# Patient Record
Sex: Male | Born: 1965 | Race: White | Hispanic: No | Marital: Married | Smoking: Current every day smoker
Health system: Southern US, Community
[De-identification: ages and names within clinical notes are randomized; demographics above are authoritative.]

## PROBLEM LIST (undated history)

## (undated) DIAGNOSIS — E119 Type 2 diabetes mellitus without complications: Secondary | ICD-10-CM

## (undated) DIAGNOSIS — I219 Acute myocardial infarction, unspecified: Secondary | ICD-10-CM

## (undated) DIAGNOSIS — E785 Hyperlipidemia, unspecified: Secondary | ICD-10-CM

## (undated) DIAGNOSIS — I2510 Atherosclerotic heart disease of native coronary artery without angina pectoris: Secondary | ICD-10-CM

## (undated) DIAGNOSIS — I10 Essential (primary) hypertension: Secondary | ICD-10-CM

## (undated) DIAGNOSIS — I82409 Acute embolism and thrombosis of unspecified deep veins of unspecified lower extremity: Secondary | ICD-10-CM

## (undated) HISTORY — PX: HERNIA REPAIR: SHX51

## (undated) HISTORY — PX: CARDIAC CATHETERIZATION: SHX172

## (undated) HISTORY — PX: TONSILLECTOMY: SUR1361

## (undated) HISTORY — DX: Acute embolism and thrombosis of unspecified deep veins of unspecified lower extremity: I82.409

---

## 2016-02-28 DIAGNOSIS — M79605 Pain in left leg: Secondary | ICD-10-CM | POA: Diagnosis not present

## 2016-03-03 DIAGNOSIS — I10 Essential (primary) hypertension: Secondary | ICD-10-CM | POA: Diagnosis not present

## 2016-03-03 DIAGNOSIS — E1165 Type 2 diabetes mellitus with hyperglycemia: Secondary | ICD-10-CM | POA: Diagnosis not present

## 2016-04-13 DIAGNOSIS — Z0000 Encounter for general adult medical examination without abnormal findings: Secondary | ICD-10-CM | POA: Diagnosis not present

## 2016-04-13 DIAGNOSIS — E119 Type 2 diabetes mellitus without complications: Secondary | ICD-10-CM | POA: Diagnosis not present

## 2016-04-13 DIAGNOSIS — I10 Essential (primary) hypertension: Secondary | ICD-10-CM | POA: Diagnosis not present

## 2016-04-29 DIAGNOSIS — E559 Vitamin D deficiency, unspecified: Secondary | ICD-10-CM | POA: Diagnosis not present

## 2016-04-29 DIAGNOSIS — I10 Essential (primary) hypertension: Secondary | ICD-10-CM | POA: Diagnosis not present

## 2016-04-29 DIAGNOSIS — E119 Type 2 diabetes mellitus without complications: Secondary | ICD-10-CM | POA: Diagnosis not present

## 2016-05-27 ENCOUNTER — Other Ambulatory Visit (HOSPITAL_COMMUNITY): Payer: Self-pay | Admitting: Internal Medicine

## 2016-05-27 DIAGNOSIS — S96911A Strain of unspecified muscle and tendon at ankle and foot level, right foot, initial encounter: Secondary | ICD-10-CM | POA: Diagnosis not present

## 2016-05-27 DIAGNOSIS — M79671 Pain in right foot: Secondary | ICD-10-CM | POA: Diagnosis not present

## 2016-05-27 DIAGNOSIS — I825Z2 Chronic embolism and thrombosis of unspecified deep veins of left distal lower extremity: Secondary | ICD-10-CM

## 2016-05-27 DIAGNOSIS — Z86718 Personal history of other venous thrombosis and embolism: Secondary | ICD-10-CM | POA: Diagnosis not present

## 2016-05-27 DIAGNOSIS — Z7901 Long term (current) use of anticoagulants: Secondary | ICD-10-CM | POA: Diagnosis not present

## 2016-05-29 ENCOUNTER — Ambulatory Visit (HOSPITAL_COMMUNITY): Payer: Self-pay

## 2016-06-03 DIAGNOSIS — E785 Hyperlipidemia, unspecified: Secondary | ICD-10-CM | POA: Diagnosis not present

## 2016-06-03 DIAGNOSIS — E291 Testicular hypofunction: Secondary | ICD-10-CM | POA: Diagnosis not present

## 2016-06-03 DIAGNOSIS — E119 Type 2 diabetes mellitus without complications: Secondary | ICD-10-CM | POA: Diagnosis not present

## 2016-07-07 ENCOUNTER — Other Ambulatory Visit (HOSPITAL_COMMUNITY): Payer: Self-pay | Admitting: Internal Medicine

## 2016-07-07 DIAGNOSIS — E041 Nontoxic single thyroid nodule: Secondary | ICD-10-CM

## 2016-07-13 ENCOUNTER — Ambulatory Visit (HOSPITAL_COMMUNITY)
Admission: RE | Admit: 2016-07-13 | Discharge: 2016-07-13 | Disposition: A | Discharge status: Home / Self Care | Payer: 59 | Source: Ambulatory Visit | Attending: Internal Medicine | Admitting: Internal Medicine

## 2016-07-13 DIAGNOSIS — E041 Nontoxic single thyroid nodule: Secondary | ICD-10-CM | POA: Insufficient documentation

## 2016-07-13 DIAGNOSIS — E049 Nontoxic goiter, unspecified: Secondary | ICD-10-CM | POA: Diagnosis not present

## 2016-07-13 DIAGNOSIS — R599 Enlarged lymph nodes, unspecified: Secondary | ICD-10-CM | POA: Diagnosis not present

## 2016-07-29 DIAGNOSIS — E785 Hyperlipidemia, unspecified: Secondary | ICD-10-CM | POA: Diagnosis not present

## 2016-07-29 DIAGNOSIS — E119 Type 2 diabetes mellitus without complications: Secondary | ICD-10-CM | POA: Diagnosis not present

## 2016-07-29 DIAGNOSIS — E291 Testicular hypofunction: Secondary | ICD-10-CM | POA: Diagnosis not present

## 2016-12-07 DIAGNOSIS — I10 Essential (primary) hypertension: Secondary | ICD-10-CM | POA: Diagnosis not present

## 2016-12-07 DIAGNOSIS — E119 Type 2 diabetes mellitus without complications: Secondary | ICD-10-CM | POA: Diagnosis not present

## 2016-12-07 DIAGNOSIS — E785 Hyperlipidemia, unspecified: Secondary | ICD-10-CM | POA: Diagnosis not present

## 2016-12-24 ENCOUNTER — Ambulatory Visit: Payer: 59 | Admitting: General Surgery

## 2017-01-03 ENCOUNTER — Encounter (HOSPITAL_COMMUNITY)
Admission: EM | Disposition: A | Discharge status: Home / Self Care | Payer: Self-pay | Source: Other Acute Inpatient Hospital | Attending: Interventional Cardiology

## 2017-01-03 ENCOUNTER — Inpatient Hospital Stay (HOSPITAL_COMMUNITY)
Admission: EM | Admit: 2017-01-03 | Discharge: 2017-01-07 | DRG: 247 | Disposition: A | Discharge status: Home / Self Care | Payer: Medicaid Other | Source: Other Acute Inpatient Hospital | Attending: Cardiovascular Disease | Admitting: Cardiovascular Disease

## 2017-01-03 ENCOUNTER — Encounter (HOSPITAL_COMMUNITY): Payer: Self-pay | Admitting: General Practice

## 2017-01-03 ENCOUNTER — Other Ambulatory Visit: Payer: Self-pay

## 2017-01-03 DIAGNOSIS — E663 Overweight: Secondary | ICD-10-CM | POA: Diagnosis present

## 2017-01-03 DIAGNOSIS — Z955 Presence of coronary angioplasty implant and graft: Secondary | ICD-10-CM

## 2017-01-03 DIAGNOSIS — I82409 Acute embolism and thrombosis of unspecified deep veins of unspecified lower extremity: Secondary | ICD-10-CM | POA: Diagnosis present

## 2017-01-03 DIAGNOSIS — E118 Type 2 diabetes mellitus with unspecified complications: Secondary | ICD-10-CM | POA: Diagnosis not present

## 2017-01-03 DIAGNOSIS — R0602 Shortness of breath: Secondary | ICD-10-CM

## 2017-01-03 DIAGNOSIS — E7800 Pure hypercholesterolemia, unspecified: Secondary | ICD-10-CM | POA: Diagnosis not present

## 2017-01-03 DIAGNOSIS — Z794 Long term (current) use of insulin: Secondary | ICD-10-CM | POA: Diagnosis not present

## 2017-01-03 DIAGNOSIS — F17210 Nicotine dependence, cigarettes, uncomplicated: Secondary | ICD-10-CM | POA: Diagnosis present

## 2017-01-03 DIAGNOSIS — I5030 Unspecified diastolic (congestive) heart failure: Secondary | ICD-10-CM | POA: Diagnosis not present

## 2017-01-03 DIAGNOSIS — E119 Type 2 diabetes mellitus without complications: Secondary | ICD-10-CM | POA: Diagnosis present

## 2017-01-03 DIAGNOSIS — I2111 ST elevation (STEMI) myocardial infarction involving right coronary artery: Secondary | ICD-10-CM | POA: Diagnosis not present

## 2017-01-03 DIAGNOSIS — E1159 Type 2 diabetes mellitus with other circulatory complications: Secondary | ICD-10-CM | POA: Diagnosis not present

## 2017-01-03 DIAGNOSIS — R509 Fever, unspecified: Secondary | ICD-10-CM

## 2017-01-03 DIAGNOSIS — Z6831 Body mass index (BMI) 31.0-31.9, adult: Secondary | ICD-10-CM | POA: Diagnosis not present

## 2017-01-03 DIAGNOSIS — I2119 ST elevation (STEMI) myocardial infarction involving other coronary artery of inferior wall: Principal | ICD-10-CM | POA: Diagnosis present

## 2017-01-03 DIAGNOSIS — I82401 Acute embolism and thrombosis of unspecified deep veins of right lower extremity: Secondary | ICD-10-CM | POA: Diagnosis not present

## 2017-01-03 DIAGNOSIS — I2510 Atherosclerotic heart disease of native coronary artery without angina pectoris: Secondary | ICD-10-CM | POA: Diagnosis present

## 2017-01-03 DIAGNOSIS — I255 Ischemic cardiomyopathy: Secondary | ICD-10-CM | POA: Diagnosis present

## 2017-01-03 DIAGNOSIS — J101 Influenza due to other identified influenza virus with other respiratory manifestations: Secondary | ICD-10-CM | POA: Diagnosis not present

## 2017-01-03 DIAGNOSIS — I252 Old myocardial infarction: Secondary | ICD-10-CM | POA: Diagnosis not present

## 2017-01-03 DIAGNOSIS — Z86718 Personal history of other venous thrombosis and embolism: Secondary | ICD-10-CM | POA: Diagnosis not present

## 2017-01-03 DIAGNOSIS — I10 Essential (primary) hypertension: Secondary | ICD-10-CM | POA: Diagnosis present

## 2017-01-03 DIAGNOSIS — E785 Hyperlipidemia, unspecified: Secondary | ICD-10-CM | POA: Diagnosis present

## 2017-01-03 HISTORY — DX: Essential (primary) hypertension: I10

## 2017-01-03 HISTORY — PX: LEFT HEART CATH AND CORONARY ANGIOGRAPHY: CATH118249

## 2017-01-03 HISTORY — DX: Hyperlipidemia, unspecified: E78.5

## 2017-01-03 HISTORY — DX: Type 2 diabetes mellitus without complications: E11.9

## 2017-01-03 HISTORY — PX: CORONARY/GRAFT ACUTE MI REVASCULARIZATION: CATH118305

## 2017-01-03 HISTORY — DX: Acute myocardial infarction, unspecified: I21.9

## 2017-01-03 HISTORY — DX: Atherosclerotic heart disease of native coronary artery without angina pectoris: I25.10

## 2017-01-03 LAB — COMPREHENSIVE METABOLIC PANEL
ALT: 65 U/L — AB (ref 17–63)
AST: 128 U/L — ABNORMAL HIGH (ref 15–41)
Albumin: 3.6 g/dL (ref 3.5–5.0)
Alkaline Phosphatase: 41 U/L (ref 38–126)
Anion gap: 13 (ref 5–15)
BILIRUBIN TOTAL: 1.1 mg/dL (ref 0.3–1.2)
BUN: 12 mg/dL (ref 6–20)
CHLORIDE: 103 mmol/L (ref 101–111)
CO2: 20 mmol/L — ABNORMAL LOW (ref 22–32)
CREATININE: 0.66 mg/dL (ref 0.61–1.24)
Calcium: 8.2 mg/dL — ABNORMAL LOW (ref 8.9–10.3)
Glucose, Bld: 288 mg/dL — ABNORMAL HIGH (ref 65–99)
POTASSIUM: 3.8 mmol/L (ref 3.5–5.1)
Sodium: 136 mmol/L (ref 135–145)
TOTAL PROTEIN: 6 g/dL — AB (ref 6.5–8.1)

## 2017-01-03 LAB — GLUCOSE, CAPILLARY
GLUCOSE-CAPILLARY: 235 mg/dL — AB (ref 65–99)
Glucose-Capillary: 231 mg/dL — ABNORMAL HIGH (ref 65–99)

## 2017-01-03 LAB — HEMOGLOBIN A1C
HEMOGLOBIN A1C: 10.4 % — AB (ref 4.8–5.6)
MEAN PLASMA GLUCOSE: 251.78 mg/dL

## 2017-01-03 LAB — CBC
HCT: 44.4 % (ref 39.0–52.0)
HEMOGLOBIN: 16 g/dL (ref 13.0–17.0)
MCH: 33.1 pg (ref 26.0–34.0)
MCHC: 36 g/dL (ref 30.0–36.0)
MCV: 91.9 fL (ref 78.0–100.0)
Platelets: 178 10*3/uL (ref 150–400)
RBC: 4.83 MIL/uL (ref 4.22–5.81)
RDW: 11.8 % (ref 11.5–15.5)
WBC: 8.8 10*3/uL (ref 4.0–10.5)

## 2017-01-03 LAB — MRSA PCR SCREENING: MRSA BY PCR: NEGATIVE

## 2017-01-03 LAB — POCT ACTIVATED CLOTTING TIME
Activated Clotting Time: 186 seconds
Activated Clotting Time: 296 seconds

## 2017-01-03 LAB — TROPONIN I: Troponin I: 13.08 ng/mL (ref <0.03)

## 2017-01-03 LAB — PROTIME-INR
INR: 1.18
PROTHROMBIN TIME: 14.9 s (ref 11.4–15.2)

## 2017-01-03 LAB — APTT

## 2017-01-03 LAB — LIPID PANEL
CHOLESTEROL: 134 mg/dL (ref 0–200)
HDL: 22 mg/dL — ABNORMAL LOW (ref >40)
LDL Cholesterol: UNDETERMINED mg/dL (ref 0–99)
TRIGLYCERIDES: 472 mg/dL — AB (ref <150)
Total CHOL/HDL Ratio: 6.1 RATIO
VLDL: UNDETERMINED mg/dL (ref 0–40)

## 2017-01-03 SURGERY — CORONARY/GRAFT ACUTE MI REVASCULARIZATION
Anesthesia: LOCAL

## 2017-01-03 MED ORDER — TICAGRELOR 90 MG PO TABS
90.0000 mg | ORAL_TABLET | Freq: Two times a day (BID) | ORAL | Status: DC
  Administered 2017-01-03 – 2017-01-05 (×4): 90 mg via ORAL
  Filled 2017-01-03 (×4): qty 1

## 2017-01-03 MED ORDER — SODIUM CHLORIDE 0.9% FLUSH: 3.0000 mL | INTRAVENOUS | Status: DC | PRN

## 2017-01-03 MED ORDER — HYDROMORPHONE HCL 1 MG/ML IJ SOLN
1.0000 mg | INTRAMUSCULAR | Status: DC | PRN
  Administered 2017-01-03 – 2017-01-04 (×2): 1 mg via INTRAVENOUS
  Filled 2017-01-03 (×2): qty 1

## 2017-01-03 MED ORDER — HEPARIN SODIUM (PORCINE) 1000 UNIT/ML IJ SOLN
INTRAMUSCULAR | Status: DC | PRN
  Administered 2017-01-03: 9000 [IU] via INTRAVENOUS
  Administered 2017-01-03: 5000 [IU] via INTRAVENOUS

## 2017-01-03 MED ORDER — HYDRALAZINE HCL 20 MG/ML IJ SOLN: 5.0000 mg | INTRAMUSCULAR | Status: AC | PRN

## 2017-01-03 MED ORDER — VERAPAMIL HCL 2.5 MG/ML IV SOLN
INTRAVENOUS | Status: DC | PRN
  Administered 2017-01-03: 10 mL via INTRA_ARTERIAL

## 2017-01-03 MED ORDER — TIROFIBAN HCL IN NACL 5-0.9 MG/100ML-% IV SOLN
INTRAVENOUS | Status: AC
  Filled 2017-01-03: qty 100

## 2017-01-03 MED ORDER — ACETAMINOPHEN 325 MG PO TABS
650.0000 mg | ORAL_TABLET | ORAL | Status: DC | PRN
  Administered 2017-01-04 – 2017-01-06 (×10): 650 mg via ORAL
  Filled 2017-01-03 (×10): qty 2

## 2017-01-03 MED ORDER — FENTANYL CITRATE (PF) 100 MCG/2ML IJ SOLN
INTRAMUSCULAR | Status: AC
  Filled 2017-01-03: qty 2

## 2017-01-03 MED ORDER — HEPARIN (PORCINE) IN NACL 2-0.9 UNIT/ML-% IJ SOLN
INTRAMUSCULAR | Status: AC
  Filled 2017-01-03: qty 500

## 2017-01-03 MED ORDER — MELATONIN 3 MG PO TABS
3.0000 mg | ORAL_TABLET | Freq: Every day | ORAL | Status: DC
  Administered 2017-01-03 – 2017-01-06 (×4): 3 mg via ORAL
  Filled 2017-01-03 (×4): qty 1

## 2017-01-03 MED ORDER — FENTANYL CITRATE (PF) 100 MCG/2ML IJ SOLN
INTRAMUSCULAR | Status: DC | PRN
  Administered 2017-01-03 (×2): 25 ug via INTRAVENOUS
  Administered 2017-01-03: 50 ug via INTRAVENOUS

## 2017-01-03 MED ORDER — ASPIRIN 81 MG PO CHEW
81.0000 mg | CHEWABLE_TABLET | Freq: Every day | ORAL | Status: DC
  Administered 2017-01-03 – 2017-01-07 (×5): 81 mg via ORAL
  Filled 2017-01-03 (×5): qty 1

## 2017-01-03 MED ORDER — IOPAMIDOL (ISOVUE-370) INJECTION 76%
INTRAVENOUS | Status: DC | PRN
  Administered 2017-01-03: 105 mL via INTRA_ARTERIAL

## 2017-01-03 MED ORDER — MIDAZOLAM HCL 2 MG/2ML IJ SOLN
INTRAMUSCULAR | Status: AC
  Filled 2017-01-03: qty 2

## 2017-01-03 MED ORDER — HEPARIN SODIUM (PORCINE) 1000 UNIT/ML IJ SOLN
INTRAMUSCULAR | Status: AC
  Filled 2017-01-03: qty 1

## 2017-01-03 MED ORDER — TICAGRELOR 90 MG PO TABS
ORAL_TABLET | ORAL | Status: AC
  Filled 2017-01-03: qty 2

## 2017-01-03 MED ORDER — ATORVASTATIN CALCIUM 80 MG PO TABS
80.0000 mg | ORAL_TABLET | Freq: Every day | ORAL | Status: DC
  Administered 2017-01-03 – 2017-01-06 (×4): 80 mg via ORAL
  Filled 2017-01-03 (×4): qty 1

## 2017-01-03 MED ORDER — INSULIN ASPART 100 UNIT/ML ~~LOC~~ SOLN
0.0000 [IU] | Freq: Three times a day (TID) | SUBCUTANEOUS | Status: DC
  Administered 2017-01-03: 5 [IU] via SUBCUTANEOUS
  Administered 2017-01-04 (×2): 8 [IU] via SUBCUTANEOUS
  Administered 2017-01-04 – 2017-01-05 (×2): 5 [IU] via SUBCUTANEOUS

## 2017-01-03 MED ORDER — ONDANSETRON HCL 4 MG/2ML IJ SOLN
4.0000 mg | Freq: Four times a day (QID) | INTRAMUSCULAR | Status: DC | PRN
  Administered 2017-01-06: 4 mg via INTRAVENOUS
  Filled 2017-01-03: qty 2

## 2017-01-03 MED ORDER — SODIUM CHLORIDE 0.9 % IV SOLN
INTRAVENOUS | Status: AC | PRN
  Administered 2017-01-03: 75 mL/h via INTRAVENOUS

## 2017-01-03 MED ORDER — TIROFIBAN HCL IN NACL 5-0.9 MG/100ML-% IV SOLN
INTRAVENOUS | Status: AC | PRN
  Administered 2017-01-03: 0.15 ug/kg/min via INTRAVENOUS

## 2017-01-03 MED ORDER — CHLORHEXIDINE GLUCONATE CLOTH 2 % EX PADS: 6.0000 | MEDICATED_PAD | Freq: Every day | CUTANEOUS | Status: DC

## 2017-01-03 MED ORDER — TIROFIBAN (AGGRASTAT) BOLUS VIA INFUSION
INTRAVENOUS | Status: DC | PRN
  Administered 2017-01-03: 2275 ug via INTRAVENOUS

## 2017-01-03 MED ORDER — SODIUM CHLORIDE 0.9 % IV SOLN: INTRAVENOUS | Status: AC

## 2017-01-03 MED ORDER — MIDAZOLAM HCL 2 MG/2ML IJ SOLN
INTRAMUSCULAR | Status: DC | PRN
  Administered 2017-01-03 (×2): 1 mg via INTRAVENOUS
  Administered 2017-01-03: 2 mg via INTRAVENOUS

## 2017-01-03 MED ORDER — IOPAMIDOL (ISOVUE-370) INJECTION 76%
INTRAVENOUS | Status: AC
  Filled 2017-01-03: qty 125

## 2017-01-03 MED ORDER — SODIUM CHLORIDE 0.9% FLUSH
3.0000 mL | Freq: Two times a day (BID) | INTRAVENOUS | Status: DC
  Administered 2017-01-03: 3 mL via INTRAVENOUS
  Administered 2017-01-04: 10 mL via INTRAVENOUS
  Administered 2017-01-04: 3 mL via INTRAVENOUS
  Administered 2017-01-05: 10 mL via INTRAVENOUS
  Administered 2017-01-06 – 2017-01-07 (×3): 3 mL via INTRAVENOUS

## 2017-01-03 MED ORDER — TICAGRELOR 90 MG PO TABS
ORAL_TABLET | ORAL | Status: DC | PRN
  Administered 2017-01-03: 180 mg via ORAL

## 2017-01-03 MED ORDER — MUPIROCIN 2 % EX OINT: 1.0000 "application " | TOPICAL_OINTMENT | Freq: Two times a day (BID) | CUTANEOUS | Status: DC

## 2017-01-03 MED ORDER — LIDOCAINE HCL (PF) 1 % IJ SOLN
INTRAMUSCULAR | Status: AC
  Filled 2017-01-03: qty 30

## 2017-01-03 MED ORDER — HEPARIN (PORCINE) IN NACL 2-0.9 UNIT/ML-% IJ SOLN
INTRAMUSCULAR | Status: AC | PRN
  Administered 2017-01-03: 1000 mL

## 2017-01-03 MED ORDER — SODIUM CHLORIDE 0.9 % IV SOLN: 250.0000 mL | INTRAVENOUS | Status: DC | PRN

## 2017-01-03 MED ORDER — VERAPAMIL HCL 2.5 MG/ML IV SOLN
INTRAVENOUS | Status: AC
  Filled 2017-01-03: qty 2

## 2017-01-03 MED ORDER — TIROFIBAN HCL IN NACL 5-0.9 MG/100ML-% IV SOLN
0.1500 ug/kg/min | INTRAVENOUS | Status: DC
  Administered 2017-01-03 – 2017-01-04 (×3): 0.15 ug/kg/min via INTRAVENOUS
  Filled 2017-01-03 (×2): qty 100

## 2017-01-03 MED ORDER — CLONAZEPAM 0.5 MG PO TABS: 0.2500 mg | ORAL_TABLET | Freq: Once | ORAL | Status: DC

## 2017-01-03 MED ORDER — LIDOCAINE HCL (PF) 1 % IJ SOLN
INTRAMUSCULAR | Status: DC | PRN
  Administered 2017-01-03: 2 mL via SUBCUTANEOUS

## 2017-01-03 MED ORDER — METOPROLOL TARTRATE 12.5 MG HALF TABLET
12.5000 mg | ORAL_TABLET | Freq: Two times a day (BID) | ORAL | Status: DC
  Administered 2017-01-03 – 2017-01-05 (×5): 12.5 mg via ORAL
  Filled 2017-01-03 (×5): qty 1

## 2017-01-03 MED ORDER — LABETALOL HCL 5 MG/ML IV SOLN: 10.0000 mg | INTRAVENOUS | Status: AC | PRN

## 2017-01-03 MED ORDER — ALPRAZOLAM 0.25 MG PO TABS
0.2500 mg | ORAL_TABLET | Freq: Once | ORAL | Status: AC
  Administered 2017-01-03: 0.25 mg via ORAL
  Filled 2017-01-03: qty 1

## 2017-01-03 SURGICAL SUPPLY — 28 items
BALLN EMERGE MR 3.0X12 (BALLOONS) ×2
BALLN SAPPHIRE 2.5X15 (BALLOONS) ×2
BALLN SAPPHIRE 3.0X12 (BALLOONS) ×2
BALLN ~~LOC~~ EUPHORA RX 3.5X8 (BALLOONS) ×2
BALLOON EMERGE MR 3.0X12 (BALLOONS) ×1 IMPLANT
BALLOON SAPPHIRE 2.5X15 (BALLOONS) ×1 IMPLANT
BALLOON SAPPHIRE 3.0X12 (BALLOONS) ×1 IMPLANT
BALLOON ~~LOC~~ EUPHORA RX 3.5X8 (BALLOONS) ×1 IMPLANT
CATH INFINITI 5 FR JL3.5 (CATHETERS) ×2 IMPLANT
CATH INFINITI 5FR ANG PIGTAIL (CATHETERS) ×2 IMPLANT
CATH LAUNCHER 6FR AL1 (CATHETERS) ×1 IMPLANT
CATH LAUNCHER 6FR JR4 (CATHETERS) ×2 IMPLANT
CATHETER LAUNCHER 6FR AL1 (CATHETERS) ×2
DEVICE RAD COMP TR BAND LRG (VASCULAR PRODUCTS) ×2 IMPLANT
GLIDESHEATH SLEND SS 6F .021 (SHEATH) ×2 IMPLANT
GUIDEWIRE INQWIRE 1.5J.035X260 (WIRE) ×1 IMPLANT
INQWIRE 1.5J .035X260CM (WIRE) ×2
KIT ENCORE 26 ADVANTAGE (KITS) ×2 IMPLANT
KIT HEART LEFT (KITS) ×2 IMPLANT
PACK CARDIAC CATHETERIZATION (CUSTOM PROCEDURE TRAY) ×2 IMPLANT
STENT SYNERGY DES 3X16 (Permanent Stent) ×2 IMPLANT
SYR MEDRAD MARK V 150ML (SYRINGE) ×2 IMPLANT
TRANSDUCER W/STOPCOCK (MISCELLANEOUS) ×2 IMPLANT
TUBING CIL FLEX 10 FLL-RA (TUBING) ×2 IMPLANT
VALVE GUARDIAN II ~~LOC~~ HEMO (MISCELLANEOUS) ×2 IMPLANT
WIRE ASAHI FIELDER XT 190CM (WIRE) ×2 IMPLANT
WIRE ASAHI PROWATER 180CM (WIRE) ×2 IMPLANT
WIRE HI TORQ BMW 190CM (WIRE) ×2 IMPLANT

## 2017-01-03 NOTE — H&P (Signed)
Cardiology Consultation:   Patient ID: Jacob Cooper; [id number]; 08/22/1965   Admit date: 01/03/2017 Date of Consult: 01/04/2017  Primary Care Provider: Wilson SingerGosrani, Nimish C, MD Primary Cardiologist: No primary care provider on file. new    Patient Profile:   Jacob Cooper is a 51 y.o. male with a hx of DM who is being seen today for the evaluation of inferior STEMI at the request of [facility].  History of Present Illness:   Jacob Cooper is a 51 y/o man with DM and HTN who had chest pain yesterday and went to the ER.  He had a negative w/u in the ER but refused admission per the ER MD that I spoke to today.  He came back to the ER today with severe CP and initial ECG showed inferior ST elevation.  We were consulted for management of his STEMI.    He has had for episodes intermittently over the last 2 days of severe chest discomfort.  He continues to have chest discomfort upon arrival to the Cath Lab.  He had 8 mg of morphine for pain control and [facility].   Past Medical History:  Diagnosis Date  . Coronary artery disease   . Diabetes mellitus without complication (HCC)   . Hypertension   . Myocardial infarction [facility](HCC)          Inpatient Medications: Scheduled Meds:  Continuous Infusions:  PRN Meds:   Allergies:   No Known Allergies  Social History:   Social History   Socioeconomic History  . Marital status: Married    Spouse name: Not on file  . Number of children: Not on file  . Years of education: Not on file  . Highest education level: Not on file  Social Needs  . Financial resource strain: Not very hard  . Food insecurity - worry: Patient refused  . Food insecurity - inability: Patient refused  . Transportation needs - medical: Patient refused  . Transportation needs - non-medical: Patient refused  Occupational History  . Not on file  Tobacco Use  . Smoking status: Current Every Day Smoker    Years: 15.00    Types: Pipe  .  Smokeless tobacco: Former NeurosurgeonUser    Types: Chew    Quit date: 1992  Substance and Sexual Activity  . Alcohol use: Yes    Alcohol/week: 1.8 - 2.4 oz    Types: 3 - 4 Shots of liquor per week  . Drug use: No  . Sexual activity: Yes    Partners: Female  Other Topics Concern  . Not on file  Social History Narrative  . Not on file    Family History:   History reviewed. No pertinent family history. Unable to obtain due to acuity of procedure.  Patent too sedated post procedure to answer.  ROS:  Please see the history of present illness.  ROS  Chest pain, All other ROS reviewed and negative.     Physical Exam/Data:   Vitals:   01/03/17 2300 01/03/17 2351 01/04/17 0000 01/04/17 0100  BP: 116/83  (!) 149/98 (!) 140/107  Pulse: 88  86 90  Resp: (!) 21  (!) 23 (!) 23  Temp:  100.3 F (37.9 C)    TempSrc:  Oral    SpO2: 91%  92% 96%  Weight:      Height:        Intake/Output Summary (Last 24 hours) at 01/04/2017 0156 Last data filed at 01/04/2017 0030 Gross per 24  hour  Intake 1490.98 ml  Output 750 ml  Net 740.98 ml   Filed Weights   01/03/17 1500 01/03/17 1519  Weight: 210 lb (95.3 kg) 210 lb (95.3 kg)   Body mass index is 31.93 kg/m.  General:  Well nourished, well developed, in no acute distress HEENT: normal Lymph: no adenopathy Neck: no JVD Endocrine:  No thryomegaly Vascular: No carotid bruits; 2+ right DP pulse,1+ left DP pulse Cardiac:  normal S1, S2; RRR; no murmur  Lungs:  clear to auscultation bilaterally, no wheezing, rhonchi or rales  Abd: soft, nontender, no hepatomegaly  Ext: no edema Musculoskeletal:  No deformities, BUE and BLE strength normal and equal Skin: warm and dry  Neuro:  CNs 2-12 intact, no focal abnormalities noted Psych:  Normal affect   EKG:  The EKG was personally reviewed and demonstrates:  NSR, inferior ST elevation Telemetry:  Telemetry was personally reviewed and demonstrates:    Relevant CV Studies:   Laboratory  Data:  Chemistry Recent Labs  Lab 01/03/17 1426  NA 136  K 3.8  CL 103  CO2 20*  GLUCOSE 288*  BUN 12  CREATININE 0.66  CALCIUM 8.2*  GFRNONAA >60  GFRAA >60  ANIONGAP 13    Recent Labs  Lab 01/03/17 1426  PROT 6.0*  ALBUMIN 3.6  AST 128*  ALT 65*  ALKPHOS 41  BILITOT 1.1   Hematology Recent Labs  Lab 01/03/17 1426  WBC 8.8  RBC 4.83  HGB 16.0  HCT 44.4  MCV 91.9  MCH 33.1  MCHC 36.0  RDW 11.8  PLT 178   Cardiac Enzymes Recent Labs  Lab 01/03/17 1426 01/03/17 2041  TROPONINI 13.08* >65.00*   No results for input(s): TROPIPOC in the last 168 hours.  BNPNo results for input(s): BNP, PROBNP in the last 168 hours.  DDimer No results for input(s): DDIMER in the last 168 hours.  Radiology/Studies:  No results found.  Assessment and Plan:   1.   Acute inferior MI: I personally reviewed the ECG and made the decision for the patient to come to the cath lab.  Plan for emergent cath.  Further plans based on the result of the cath.  He will need aggressive DM control and RF modification. Start high dose statin.   2.  SSI.  Avoid tobacco.   For questions or updates, please contact [facility] Please consult [email or web address] for contact info under Cardiology/STEMI.   Signed, Lance MussJayadeep Kabrea Seeney, MD  01/04/2017 1:56 AM   Addendum: Patient with occluded RCA.  Treated stent to mid RCA and PTCA to distal RCA.  Tirofiban for 18 hours. I spoke to the wife at length.  He smokes a pipe.  Tobacco cessation to be encouraged.  Possible IV heparin as well given residual thrombus in the distal RCA, if no bleeding post sheath removal. Nonobstructive left sided disease.  Decreased LVEF.  Start ACE-I tomorrow if renal function stable. Check lipids.   Arrange f/u in [place].

## 2017-01-03 NOTE — Plan of Care (Signed)
  Progressing Spiritual Needs Ability to function at adequate level 01/03/2017 2032 - Progressing by Jasper RilingSarine, Marlise Fahr M, RN Note Chaplain visited with patient Education: Understanding of CV disease, CV risk reduction, and recovery process will improve 01/03/2017 2032 - Progressing by Jasper RilingSarine, Glennie Rodda M, RN Note Began discussion, patient states this is not new to him Activity: Ability to return to baseline activity level will improve 01/03/2017 2032 - Progressing by Jasper RilingSarine, Keziah Drotar M, RN Note Pt able to move in bed, offered to help patient out of bed when he was ready Cardiovascular: Ability to achieve and maintain adequate cardiovascular perfusion will improve 01/03/2017 2032 - Progressing by Jasper RilingSarine, Zyonna Vardaman M, RN Vascular access site(s) Level 0-1 will be maintained 01/03/2017 2032 - Progressing by Jasper RilingSarine, Tola Meas M, RN Note Access site is level 1 with bruising, rt arm elevated and ice applied, reminded patient to keep arm resting  Health Behavior/Discharge Planning: Ability to safely manage health-related needs after discharge will improve 01/03/2017 2032 - Progressing by Jasper RilingSarine, Devlyn Retter M, RN Education: Knowledge of General Education information will improve 01/03/2017 2032 - Progressing by Jasper RilingSarine, Evanee Lubrano M, RN Health Behavior/Discharge Planning: Ability to manage health-related needs will improve 01/03/2017 2032 - Progressing by Jasper RilingSarine, Brighten Orndoff M, RN Clinical Measurements: Will remain free from infection 01/03/2017 2032 - Progressing by Jasper RilingSarine, Dionicia Cerritos M, RN Diagnostic test results will improve 01/03/2017 2032 - Progressing by Jasper RilingSarine, Valia Wingard M, RN Nutrition: Adequate nutrition will be maintained 01/03/2017 2032 - Progressing by Jasper RilingSarine, Khairi Garman M, RN

## 2017-01-03 NOTE — Progress Notes (Signed)
   01/03/17 1257  Clinical Encounter Type  Visited With Patient;Health care provider  Visit Type ED  Referral From Nurse  Consult/Referral To Chaplain  Spiritual Encounters  Spiritual Needs Emotional;Prayer   Responded to a Code Stemi page.  [facility]  Walked with patient and EMS to the Cathlab providing comfort to the patient and inquiring about family coming.  Let the ED desk know to page me when family arrived.  Patient said he wanted to talk to me after his procedure.  Will follow as needed. Chaplain Agustin CreeNewton Marquice Uddin

## 2017-01-03 NOTE — Progress Notes (Signed)
   01/03/17 1900  Clinical Encounter Type  Visited With Patient and family together  Visit Type Follow-up  Spiritual Encounters  Spiritual Needs Emotional   Patient came into the ED and straight to Cath Lab as a Code Stemi.  Rounding on him to see how he was doing and visited with he and his wife.  They appreciated the follow up care and the prayers.  Will follow as needed. Chaplain Agustin CreeNewton Shankar Silber

## 2017-01-04 ENCOUNTER — Inpatient Hospital Stay (HOSPITAL_COMMUNITY): Payer: Medicaid Other

## 2017-01-04 ENCOUNTER — Encounter (HOSPITAL_COMMUNITY): Payer: Self-pay | Admitting: Interventional Cardiology

## 2017-01-04 DIAGNOSIS — I2119 ST elevation (STEMI) myocardial infarction involving other coronary artery of inferior wall: Principal | ICD-10-CM

## 2017-01-04 DIAGNOSIS — E118 Type 2 diabetes mellitus with unspecified complications: Secondary | ICD-10-CM

## 2017-01-04 DIAGNOSIS — I10 Essential (primary) hypertension: Secondary | ICD-10-CM

## 2017-01-04 DIAGNOSIS — Z794 Long term (current) use of insulin: Secondary | ICD-10-CM

## 2017-01-04 DIAGNOSIS — I5030 Unspecified diastolic (congestive) heart failure: Secondary | ICD-10-CM

## 2017-01-04 LAB — BASIC METABOLIC PANEL
Anion gap: 11 (ref 5–15)
BUN: 10 mg/dL (ref 6–20)
CALCIUM: 8.4 mg/dL — AB (ref 8.9–10.3)
CHLORIDE: 98 mmol/L — AB (ref 101–111)
CO2: 22 mmol/L (ref 22–32)
CREATININE: 0.69 mg/dL (ref 0.61–1.24)
Glucose, Bld: 253 mg/dL — ABNORMAL HIGH (ref 65–99)
Potassium: 3.8 mmol/L (ref 3.5–5.1)
SODIUM: 131 mmol/L — AB (ref 135–145)

## 2017-01-04 LAB — CBC
HCT: 42.8 % (ref 39.0–52.0)
Hemoglobin: 14.8 g/dL (ref 13.0–17.0)
MCH: 32 pg (ref 26.0–34.0)
MCHC: 34.6 g/dL (ref 30.0–36.0)
MCV: 92.6 fL (ref 78.0–100.0)
PLATELETS: 175 10*3/uL (ref 150–400)
RBC: 4.62 MIL/uL (ref 4.22–5.81)
RDW: 11.9 % (ref 11.5–15.5)
WBC: 7.6 10*3/uL (ref 4.0–10.5)

## 2017-01-04 LAB — HEPARIN LEVEL (UNFRACTIONATED)
HEPARIN UNFRACTIONATED: 0.17 [IU]/mL — AB (ref 0.30–0.70)
Heparin Unfractionated: 0.33 IU/mL (ref 0.30–0.70)

## 2017-01-04 LAB — ECHOCARDIOGRAM COMPLETE
Height: 68 in
Weight: 3360 oz

## 2017-01-04 LAB — GLUCOSE, CAPILLARY
GLUCOSE-CAPILLARY: 225 mg/dL — AB (ref 65–99)
Glucose-Capillary: 282 mg/dL — ABNORMAL HIGH (ref 65–99)
Glucose-Capillary: 233 mg/dL — ABNORMAL HIGH (ref 65–99)

## 2017-01-04 LAB — TROPONIN I: Troponin I: 51.96 ng/mL

## 2017-01-04 LAB — APTT: APTT: 40 s — AB (ref 24–36)

## 2017-01-04 MED ORDER — LISINOPRIL 2.5 MG PO TABS: 2.5000 mg | ORAL_TABLET | Freq: Every day | ORAL | Status: DC

## 2017-01-04 MED ORDER — LIVING WELL WITH DIABETES BOOK
Freq: Once | Status: AC
  Administered 2017-01-04: 21:00:00
  Filled 2017-01-04: qty 1

## 2017-01-04 MED ORDER — GUAIFENESIN-DM 100-10 MG/5ML PO SYRP
5.0000 mL | ORAL_SOLUTION | ORAL | Status: DC | PRN
  Administered 2017-01-04 – 2017-01-05 (×7): 5 mL via ORAL
  Filled 2017-01-04 (×7): qty 5

## 2017-01-04 MED ORDER — HEPARIN (PORCINE) IN NACL 100-0.45 UNIT/ML-% IJ SOLN
1650.0000 [IU]/h | INTRAMUSCULAR | Status: DC
  Administered 2017-01-05: 1650 [IU]/h via INTRAVENOUS
  Filled 2017-01-04 (×2): qty 250

## 2017-01-04 MED ORDER — HEPARIN (PORCINE) IN NACL 100-0.45 UNIT/ML-% IJ SOLN
1300.0000 [IU]/h | INTRAMUSCULAR | Status: DC
  Administered 2017-01-04: 1300 [IU]/h via INTRAVENOUS
  Filled 2017-01-04: qty 250

## 2017-01-04 MED ORDER — HEART ATTACK BOUNCING BOOK
Freq: Once | Status: AC
  Administered 2017-01-04: 21:00:00
  Filled 2017-01-04: qty 1

## 2017-01-04 MED ORDER — LOSARTAN POTASSIUM 50 MG PO TABS
50.0000 mg | ORAL_TABLET | Freq: Every day | ORAL | Status: DC
  Administered 2017-01-04 – 2017-01-07 (×4): 50 mg via ORAL
  Filled 2017-01-04 (×4): qty 1

## 2017-01-04 MED ORDER — EXERCISE FOR HEART AND HEALTH BOOK
Freq: Once | Status: AC
  Administered 2017-01-04: 21:00:00
  Filled 2017-01-04: qty 1

## 2017-01-04 MED ORDER — AMLODIPINE BESYLATE 5 MG PO TABS
5.0000 mg | ORAL_TABLET | Freq: Every day | ORAL | Status: DC
  Administered 2017-01-04 – 2017-01-07 (×4): 5 mg via ORAL
  Filled 2017-01-04 (×4): qty 1

## 2017-01-04 MED ORDER — ACETAMINOPHEN 325 MG PO TABS
650.0000 mg | ORAL_TABLET | Freq: Once | ORAL | Status: AC
  Administered 2017-01-04: 650 mg via ORAL
  Filled 2017-01-04: qty 2

## 2017-01-04 MED ORDER — LISINOPRIL 2.5 MG PO TABS
2.5000 mg | ORAL_TABLET | Freq: Every day | ORAL | Status: DC
  Administered 2017-01-04: 2.5 mg via ORAL
  Filled 2017-01-04: qty 1

## 2017-01-04 NOTE — Progress Notes (Signed)
[id number] MI education started with pt and wife who voiced understanding. Will follow up Wednesday. Gave wife stent card and discussed antiplatelet. If pt remains on brilinta will need to see case manager. Reviewed smoking cessation and offered fake cigarette. Pt stated he will quit pipe cold [place]. Reviewed NTG use, MI restrictions, risk factors, CRP 2. [facility] but pt stated with work schedule that he will probably not be able to attend. Left diabetic and heart healthy diet sheets for wife to read. Will continue ed next visit and ambulate with pt.

## 2017-01-04 NOTE — Progress Notes (Signed)
EKG CRITICAL VALUE     12 lead EKG performed.  Critical value noted.  Octaviano Battyebecca S, RN notified.   Rachel BoMiranda M Jaycee Mckellips, CCT 01/04/2017 7:10 AM

## 2017-01-04 NOTE — Progress Notes (Addendum)
ANTICOAGULATION CONSULT NOTE - Follow-Up Consult  Pharmacy Consult for Heparin Indication: RCA thrombus  No Known Allergies  Patient Measurements: Height: 5\' 8"  (172.7 cm) Weight: 210 lb (95.3 kg) IBW/kg (Calculated) : 68.4 Heparin Dosing Weight: 90 kg  Vital Signs: Temp: 98.6 F (37 C) (12/24 1118) Temp Source: Axillary (12/24 1118) BP: 138/95 (12/24 0700) Pulse Rate: 89 (12/24 0700)  Labs: Recent Labs    01/03/17 1426 01/03/17 2041 01/04/17 0224 01/04/17 1040  HGB 16.0  --  14.8  --   HCT 44.4  --  42.8  --   PLT 178  --  175  --   APTT >200*  --   --   --   LABPROT 14.9  --   --   --   INR 1.18  --   --   --   HEPARINUNFRC  --   --   --  0.17*  CREATININE 0.66  --  0.69  --   TROPONINI 13.08* >65.00* 51.96*  --     Estimated Creatinine Clearance: 121 mL/min (by C-G formula based on SCr of 0.69 mg/dL).   Medical History: Past Medical History:  Diagnosis Date  . Coronary artery disease   . Diabetes mellitus without complication (HCC)   . Hypertension   . Myocardial infarction [facility](HCC)     Medications:  Medications Prior to Admission  Medication Sig Dispense Refill Last Dose  . amLODipine (NORVASC) 5 MG tablet Take 5 mg by mouth daily.   01/02/2017 at Unknown time  . atorvastatin (LIPITOR) 80 MG tablet Take 80 mg by mouth daily.   01/02/2017 at Unknown time  . losartan (COZAAR) 50 MG tablet Take 50 mg by mouth daily.   01/02/2017 at Unknown time  . metFORMIN (GLUCOPHAGE) 500 MG tablet Take 500 mg by mouth 2 (two) times daily with a meal.   01/02/2017 at Unknown time  . rivaroxaban (XARELTO) 20 MG TABS tablet Take 20 mg by mouth daily.   01/02/2017 at 1800  . glipiZIDE (GLUCOTROL XL) 5 MG 24 hr tablet Take 5 mg by mouth daily with breakfast.       Assessment: 51 y.o. male s/p STEMI who received DES to mid RCA. Heparin continued due to residual RCA thrombus. Was on aggrastat in combination (which has stopped after 18 hours). Heparin level today came back at  0.17, subtherapeutic, on 1300 units/hr. APTT correlates with a value of 40 - will monitor via heparin levels. No issues with infusion or site per nursing. No signs/symptoms of bleeding. CBC remains stable.  Per cardiology notes, plan to continue heparin for 48 hours at which he will be transitioned back to Xarelto (last dose of Xarelto was on 12/22 in PM). Patient was started on Brilinta this admission for DES- per cardiology plan to transition back to plavix to allow triple therapy for 1 month then stopping aspirin. Will likely need to reload with plavix upon transitioning from Brilinta therapy.    Goal of Therapy:  Heparin level 0.3-0.7 units/ml Monitor platelets by anticoagulation protocol: Yes   Plan:  Increase heparin to 1600 units/hr Check heparin level in 6 hours.  Monitor daily heparin levels and CBC while on therapy F/u on plans from cardiology regarding transitioning with Xarelto and plavix  Girard CooterKimberly Perkins, PharmD Clinical Pharmacist  Pager: [phone] Clinical Phone for 01/04/2017 until 3:30pm: [phone] If after 3:30pm, please call main pharmacy at [phone] 01/04/2017,11:32 AM

## 2017-01-04 NOTE — Progress Notes (Signed)
Dr. Hildred Alaminevineni by to see patient, asked to look at bruising on rt arm r/t cath site, verified stable

## 2017-01-04 NOTE — Progress Notes (Signed)
Once r/o any current bleeding via urine/GI source Dr. Eldridge DaceVaranasi requested a heparin gtt be started in order to help with the thrombus in the RCA.   Pharmacy consult for heparin gtt has been placed.   Radial cath site also checked. No sign of hematoma.  Halina AndreasHarish Devineni Cardiology fellow

## 2017-01-04 NOTE — Progress Notes (Signed)
ANTICOAGULATION CONSULT NOTE - Follow-Up Consult  Pharmacy Consult for Heparin Indication: RCA thrombus  No Known Allergies  Patient Measurements: Height: 5\' 8"  (172.7 cm) Weight: 210 lb (95.3 kg) IBW/kg (Calculated) : 68.4 Heparin Dosing Weight: 90 kg  Vital Signs: Temp: 100.4 F (38 C) (12/24 1700) Temp Source: Oral (12/24 1700) BP: 143/92 (12/24 1518) Pulse Rate: 83 (12/24 1100)  Labs: Recent Labs    01/03/17 1426 01/03/17 2041 01/04/17 0224 01/04/17 1040 01/04/17 1741  HGB 16.0  --  14.8  --   --   HCT 44.4  --  42.8  --   --   PLT 178  --  175  --   --   APTT >200*  --   --  40*  --   LABPROT 14.9  --   --   --   --   INR 1.18  --   --   --   --   HEPARINUNFRC  --   --   --  0.17* 0.33  CREATININE 0.66  --  0.69  --   --   TROPONINI 13.08* >65.00* 51.96*  --   --     Estimated Creatinine Clearance: 121 mL/min (by C-G formula based on SCr of 0.69 mg/dL).  Assessment: 51 y.o. male s/p STEMI who received DES to mid RCA. Heparin continued due to residual RCA thrombus. Was on aggrastat in combination (which has stopped after 18 hours).   Per cardiology notes, plan to continue heparin for 48 hours at which he will be transitioned back to Xarelto (last dose of Xarelto was on 12/22 in PM). Patient was started on Brilinta this admission for DES- per cardiology plan to transition back to Plavix to allow triple therapy for 1 month then stopping aspirin. Will likely need to reload with Plavix upon transitioning from Brilinta therapy.   Heparin level this evening in range, but on lower end at 0.33 units/mL. No bleeding noted.   Goal of Therapy:  Heparin level 0.3-0.7 units/ml Monitor platelets by anticoagulation protocol: Yes   Plan:  Increase heparin to 1700 units/hr Monitor daily heparin levels and CBC while on therapy F/u on plans from cardiology regarding transitioning with Xarelto and Plavix  Tenisha Fleece D. Jax Abdelrahman, PharmD, BCPS Clinical Pharmacist   [phone] 01/04/2017 6:52 PM

## 2017-01-04 NOTE — Progress Notes (Signed)
Nutrition Brief Note  Patient identified on the Malnutrition Screening Tool (MST) Report  Wt Readings from Last 15 Encounters:  01/03/17 210 lb (95.3 kg)    Body mass index is 31.93 kg/m. Patient meets criteria for obesity class I based on current BMI.   Current diet order is Carb modified, patient is consuming approximately 100% of meals at this time. Labs and medications reviewed.   Spoke with wife and pt briefly. Offered DM diet education. Pt does not feel up to DM education at this time. He is willing to watch DM videos later and left him written education materials with my name and number for follow up questions. Recommend outpatient DM education.   Kendell BaneHeather Daneshia Tavano RD, LDN, CNSC [phone] Pager [phone] After Hours Pager

## 2017-01-04 NOTE — Progress Notes (Signed)
ANTICOAGULATION CONSULT NOTE - Initial Consult  Pharmacy Consult for Heparin Indication: RCA thrombus  No Known Allergies  Patient Measurements: Height: 5\' 8"  (172.7 cm) Weight: 210 lb (95.3 kg) IBW/kg (Calculated) : 68.4 Heparin Dosing Weight: 90 kg  Vital Signs: Temp: 100.3 F (37.9 C) (12/23 2351) Temp Source: Oral (12/23 2351) BP: 140/107 (12/24 0100) Pulse Rate: 90 (12/24 0100)  Labs: Recent Labs    01/03/17 1426 01/03/17 2041  HGB 16.0  --   HCT 44.4  --   PLT 178  --   APTT >200*  --   LABPROT 14.9  --   INR 1.18  --   CREATININE 0.66  --   TROPONINI 13.08* >65.00*    Estimated Creatinine Clearance: 121 mL/min (by C-G formula based on SCr of 0.66 mg/dL).   Medical History: Past Medical History:  Diagnosis Date  . Coronary artery disease   . Diabetes mellitus without complication (HCC)   . Hypertension   . Myocardial infarction [facility](HCC)     Medications:  Medications Prior to Admission  Medication Sig Dispense Refill Last Dose  . amLODipine (NORVASC) 5 MG tablet Take 5 mg by mouth daily.   01/02/2017 at Unknown time  . atorvastatin (LIPITOR) 80 MG tablet Take 80 mg by mouth daily.   01/02/2017 at Unknown time  . losartan (COZAAR) 50 MG tablet Take 50 mg by mouth daily.   01/02/2017 at Unknown time  . metFORMIN (GLUCOPHAGE) 500 MG tablet Take 500 mg by mouth 2 (two) times daily with a meal.   01/02/2017 at Unknown time  . rivaroxaban (XARELTO) 20 MG TABS tablet Take 20 mg by mouth daily.   01/02/2017 at 1800  . glipiZIDE (GLUCOTROL XL) 5 MG 24 hr tablet Take 5 mg by mouth daily with breakfast.       Assessment: 51 y.o. male s/p STEMI, residual RCA thrombus, for heparin Goal of Therapy:  Heparin level 0.3-0.7 units/ml Monitor platelets by anticoagulation protocol: Yes   Plan:  Start heparin 1300 units/hr Check heparin level in 8 hours.   Eddie Candlebbott, Kaleiah Kutzer [place] 01/04/2017,2:09 AM

## 2017-01-04 NOTE — Plan of Care (Signed)
  Progressing Education: Understanding of CV disease, CV risk reduction, and recovery process will improve 01/04/2017 2013 - Progressing by Jasper RilingSarine, Trinadee Verhagen M, RN Cardiovascular: Ability to achieve and maintain adequate cardiovascular perfusion will improve 01/04/2017 2013 - Progressing by Jasper RilingSarine, Mariana Wiederholt M, RN Vascular access site(s) Level 0-1 will be maintained 01/04/2017 2013 - Progressing by Jasper RilingSarine, Vastie Douty M, RN Health Behavior/Discharge Planning: Ability to safely manage health-related needs after discharge will improve 01/04/2017 2013 - Progressing by Jasper RilingSarine, Elizabeht Suto M, RN Education: Knowledge of General Education information will improve 01/04/2017 2013 - Progressing by Jasper RilingSarine, Abid Bolla M, RN Health Behavior/Discharge Planning: Ability to manage health-related needs will improve 01/04/2017 2013 - Progressing by Jasper RilingSarine, Lasaundra Riche M, RN Education: Understanding of cardiac disease, CV risk reduction, and recovery process will improve 01/04/2017 2013 - Progressing by Jasper RilingSarine, Rafal Archuleta M, RN Understanding of medication regimen will improve 01/04/2017 2013 - Progressing by Jasper RilingSarine, Olean Sangster M, RN Activity: Ability to tolerate increased activity will improve 01/04/2017 2013 - Progressing by Jasper RilingSarine, Kipper Buch M, RN Cardiac: Ability to achieve and maintain adequate cardiopulmonary perfusion will improve 01/04/2017 2013 - Progressing by Jasper RilingSarine, Kashira Behunin M, RN Vascular access site(s) Level 0-1 will be maintained 01/04/2017 2013 - Progressing by Jasper RilingSarine, Shernita Rabinovich M, RN Health Behavior/Discharge Planning: Ability to safely manage health-related needs after discharge will improve 01/04/2017 2013 - Progressing by Jasper RilingSarine, Deavon Podgorski M, RN

## 2017-01-04 NOTE — Progress Notes (Signed)
Progress Note  Patient Name: Jacob Cooper Date of Encounter: 01/04/2017  Primary Cardiologist: Eldridge DaceVaranasi   Subjective   Mr. Jacob Cooper is a 51 year old mildly overweight married Caucasian male who was admitted yesterday with an inferior STEMI treated with PCI and drug-eluting stenting of the mid RCA by Dr. Eldridge DaceVaranasi .  He had residual thrombus burden in the distal RCA and was placed on Aggrastat for 18 hours as well as heparin.  The procedure was performed radially.  He denies chest pain.  He does have a cough and has a low-grade fever.  Inpatient Medications    Scheduled Meds: . amLODipine  5 mg Oral Daily  . aspirin  81 mg Oral Daily  . atorvastatin  80 mg Oral q1800  . insulin aspart  0-15 Units Subcutaneous TID WC  . losartan  50 mg Oral Daily  . Melatonin  3 mg Oral QHS  . metoprolol tartrate  12.5 mg Oral BID  . sodium chloride flush  3 mL Intravenous Q12H  . ticagrelor  90 mg Oral BID   Continuous Infusions: . sodium chloride    . heparin 1,300 Units/hr (01/04/17 0600)  . tirofiban 0.15 mcg/kg/min (01/04/17 0600)   PRN Meds: sodium chloride, acetaminophen, guaiFENesin-dextromethorphan, HYDROmorphone (DILAUDID) injection, ondansetron (ZOFRAN) IV, sodium chloride flush   Vital Signs    Vitals:   01/04/17 0600 01/04/17 0700 01/04/17 0731 01/04/17 0800  BP: (!) 134/97 (!) 138/95    Pulse: 90 89    Resp: (!) 21 (!) 25    Temp:   98.5 F (36.9 C) 99.8 F (37.7 C)  TempSrc:   Oral Oral  SpO2: 92% 95%    Weight:      Height:        Intake/Output Summary (Last 24 hours) at 01/04/2017 0810 Last data filed at 01/04/2017 0700 Gross per 24 hour  Intake 2147.71 ml  Output 2150 ml  Net -2.29 ml   Filed Weights   01/03/17 1500 01/03/17 1519  Weight: 210 lb (95.3 kg) 210 lb (95.3 kg)    Telemetry    Sinus rhythm- Personally Reviewed  ECG    Normal sinus rhythm with inferior Q waves and inferior T wave inversion at 92.- Personally Reviewed  Physical Exam   GEN:  No acute distress.   Neck: No JVD Cardiac: RRR, no murmurs, rubs, or gallops.  Respiratory: Clear to auscultation bilaterally. GI: Soft, nontender, non-distended  MS: No edema; No deformity. Neuro:  Nonfocal  Psych: Normal affect  Extremity- right radial puncture site intact  Labs    Chemistry Recent Labs  Lab 01/03/17 1426 01/04/17 0224  NA 136 131*  K 3.8 3.8  CL 103 98*  CO2 20* 22  GLUCOSE 288* 253*  BUN 12 10  CREATININE 0.66 0.69  CALCIUM 8.2* 8.4*  PROT 6.0*  --   ALBUMIN 3.6  --   AST 128*  --   ALT 65*  --   ALKPHOS 41  --   BILITOT 1.1  --   GFRNONAA >60 >60  GFRAA >60 >60  ANIONGAP 13 11     Hematology Recent Labs  Lab 01/03/17 1426 01/04/17 0224  WBC 8.8 7.6  RBC 4.83 4.62  HGB 16.0 14.8  HCT 44.4 42.8  MCV 91.9 92.6  MCH 33.1 32.0  MCHC 36.0 34.6  RDW 11.8 11.9  PLT 178 175    Cardiac Enzymes Recent Labs  Lab 01/03/17 1426 01/03/17 2041 01/04/17 0224  TROPONINI 13.08* >65.00* 51.96*  No results for input(s): TROPIPOC in the last 168 hours.   BNPNo results for input(s): BNP, PROBNP in the last 168 hours.   DDimer No results for input(s): DDIMER in the last 168 hours.   Radiology    No results found.  Cardiac Studies   Cardiac catheterization/intervention (01/03/17)  Conclusion     Mid RCA lesion is 99% stenosed. Severely tortuous vessel.  A drug-eluting stent was successfully placed using a STENT SYNERGY DES 3X16.  Post intervention, there is a 0% residual stenosis.  Mid RCA to Dist RCA lesion is 90% stenosed. Likely thrombus embolized from the initial lesion.  Balloon angioplasty was performed using a BALLOON EMERGE MR 3.0X12. Due to 360 degree loop, it was very unlikely a stent would traverse through there.  Post intervention, there is a 10% residual stenosis.  There is moderate left ventricular systolic dysfunction.  The left ventricular ejection fraction is 35-45% by visual estimate.  LV end diastolic  pressure is mildly elevated.  There is no aortic valve stenosis.   Continue IV tirofiban for 18 hours.  He will need dual antiplatelet therapy with aspirin and Brilinta.  He will need aggressive secondary prevention including high-dose statin, smoking cessation, diabetes control.  Check echocardiogram to evaluate LV function and mitral valve function given the location of the infarct.  Given the thrombus burden, I would expect significant troponin elevation.    He will likely be in the hospital for 2-3 days.     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  He does have a history of tobacco abuse, history of hypertension, hyperlipidemia and diabetes with hemoglobin A1c in the 10 range.  He was transferred for an inferior STEMI which Dr. Eldridge DaceVaranasi adenopathy performed via the right radial approach.  He had an occluded mid dominant RCA and had stenting with a drug-eluting stent.  His EF was in the 35-45% range with severe inferior hypokinesia.  He did have extensive distal RCA thrombus treated with Aggrastat for 18 hours.  He has no residual chest pain.  He does have a cough probably bronchitic.  He has a low-grade fever as well.  We will keep him on IV heparin for 48 hours and monitor him here in unit to H.  If we will need to consider switching from Brilinta to Plavix given the fact that he has been on Xarelto for DVT.  Assessment & Plan    1: Inferior STEMI-postop day #1 inferior STEMI from occlusion of a mid dominant RCA treated with PCI and drug-eluting stenting.  This is a technically challenging case given a distal "loop".  He had residual distal thrombus at the "crux" treated with Aggrastat overnight.  We will continue IV heparin for 48 hours.  The procedure was performed radially.  The radial puncture site appears intact.  The patient is on low-dose aspirin and Brilinta.  He was on Xarelto as an outpatient for DVT.  We may need to  transition him to Plavix so that he can be on triple therapy for 1 month after which we can stop the aspirin.  He will need lifestyle modification including smoking cessation and more aggressive treatment of his diabetes and hyperlipidemia.  2: Essential hypertension-his blood pressure is moderately elevated today.  He was begun on low-dose beta-blocker.  He was on losartan and amlodipine at home which I will restart and we will continue to monitor.  3: Hyperlipidemia- started on  high-dose statin  4: Type 2 diabetes-on insulin  We will keep in unit 2 H for now continuing IV heparin for 48 hours and titrating his antihypertensive medications.  We will transition to Plavix tomorrow at which time we will start his Xarelto after his IV heparin is discontinued.  For questions or updates, please contact [facility] Please consult [email or web address] for contact info under Cardiology/STEMI.      Signed, Nanetta Batty, MD  01/04/2017, 8:10 AM

## 2017-01-04 NOTE — Progress Notes (Signed)
  Echocardiogram 2D Echocardiogram has been performed.  Roosvelt MaserLane, Maurice Ramseur F 01/04/2017, 10:42 AM

## 2017-01-05 ENCOUNTER — Inpatient Hospital Stay (HOSPITAL_COMMUNITY): Payer: Medicaid Other

## 2017-01-05 DIAGNOSIS — I10 Essential (primary) hypertension: Secondary | ICD-10-CM

## 2017-01-05 DIAGNOSIS — R509 Fever, unspecified: Secondary | ICD-10-CM

## 2017-01-05 DIAGNOSIS — R0602 Shortness of breath: Secondary | ICD-10-CM

## 2017-01-05 DIAGNOSIS — Z955 Presence of coronary angioplasty implant and graft: Secondary | ICD-10-CM

## 2017-01-05 DIAGNOSIS — I82409 Acute embolism and thrombosis of unspecified deep veins of unspecified lower extremity: Secondary | ICD-10-CM

## 2017-01-05 LAB — BASIC METABOLIC PANEL
ANION GAP: 12 (ref 5–15)
BUN: 12 mg/dL (ref 6–20)
CALCIUM: 8.1 mg/dL — AB (ref 8.9–10.3)
CHLORIDE: 99 mmol/L — AB (ref 101–111)
CO2: 20 mmol/L — AB (ref 22–32)
Creatinine, Ser: 0.72 mg/dL (ref 0.61–1.24)
GFR calc non Af Amer: >60 mL/min (ref >60)
GLUCOSE: 234 mg/dL — AB (ref 65–99)
POTASSIUM: 3.8 mmol/L (ref 3.5–5.1)
Sodium: 131 mmol/L — ABNORMAL LOW (ref 135–145)

## 2017-01-05 LAB — CBC
HEMATOCRIT: 43.8 % (ref 39.0–52.0)
HEMOGLOBIN: 15.3 g/dL (ref 13.0–17.0)
MCH: 32.3 pg (ref 26.0–34.0)
MCHC: 34.9 g/dL (ref 30.0–36.0)
MCV: 92.6 fL (ref 78.0–100.0)
Platelets: 144 10*3/uL — ABNORMAL LOW (ref 150–400)
RBC: 4.73 MIL/uL (ref 4.22–5.81)
RDW: 12 % (ref 11.5–15.5)
WBC: 5.9 10*3/uL (ref 4.0–10.5)

## 2017-01-05 LAB — URINALYSIS, ROUTINE W REFLEX MICROSCOPIC
BACTERIA UA: NONE SEEN
BILIRUBIN URINE: NEGATIVE
Hgb urine dipstick: NEGATIVE
Ketones, ur: 5 mg/dL — AB
LEUKOCYTES UA: NEGATIVE
NITRITE: NEGATIVE
PH: 7 (ref 5.0–8.0)
Protein, ur: NEGATIVE mg/dL
SPECIFIC GRAVITY, URINE: 1.018 (ref 1.005–1.030)
Squamous Epithelial / LPF: NONE SEEN

## 2017-01-05 LAB — INFLUENZA PANEL BY PCR (TYPE A & B)
INFLAPCR: POSITIVE — AB
Influenza B By PCR: NEGATIVE

## 2017-01-05 LAB — GLUCOSE, CAPILLARY
GLUCOSE-CAPILLARY: 232 mg/dL — AB (ref 65–99)
GLUCOSE-CAPILLARY: 252 mg/dL — AB (ref 65–99)
GLUCOSE-CAPILLARY: 249 mg/dL — AB (ref 65–99)
Glucose-Capillary: 280 mg/dL — ABNORMAL HIGH (ref 65–99)

## 2017-01-05 LAB — HEPARIN LEVEL (UNFRACTIONATED): HEPARIN UNFRACTIONATED: 0.37 [IU]/mL (ref 0.30–0.70)

## 2017-01-05 MED ORDER — RIVAROXABAN 20 MG PO TABS: 20.0000 mg | ORAL_TABLET | Freq: Every day | ORAL | Status: DC

## 2017-01-05 MED ORDER — CLOPIDOGREL BISULFATE 300 MG PO TABS
300.0000 mg | ORAL_TABLET | Freq: Once | ORAL | Status: AC
  Administered 2017-01-05: 300 mg via ORAL
  Filled 2017-01-05: qty 1

## 2017-01-05 MED ORDER — RIVAROXABAN 20 MG PO TABS
20.0000 mg | ORAL_TABLET | Freq: Every day | ORAL | Status: DC
Start: 2017-01-05 — End: 2017-01-07
  Administered 2017-01-05 – 2017-01-06 (×2): 20 mg via ORAL
  Filled 2017-01-05 (×2): qty 1

## 2017-01-05 MED ORDER — INSULIN ASPART 100 UNIT/ML ~~LOC~~ SOLN
0.0000 [IU] | Freq: Three times a day (TID) | SUBCUTANEOUS | Status: DC
  Administered 2017-01-05 (×2): 11 [IU] via SUBCUTANEOUS
  Administered 2017-01-06: 7 [IU] via SUBCUTANEOUS
  Administered 2017-01-06: 11 [IU] via SUBCUTANEOUS
  Administered 2017-01-06: 15 [IU] via SUBCUTANEOUS
  Administered 2017-01-07: 11 [IU] via SUBCUTANEOUS

## 2017-01-05 MED ORDER — METOPROLOL TARTRATE 25 MG PO TABS
25.0000 mg | ORAL_TABLET | Freq: Two times a day (BID) | ORAL | Status: DC
  Administered 2017-01-05 – 2017-01-07 (×4): 25 mg via ORAL
  Filled 2017-01-05 (×4): qty 1

## 2017-01-05 MED ORDER — IOPAMIDOL (ISOVUE-370) INJECTION 76%
INTRAVENOUS | Status: AC
  Administered 2017-01-05: 100 mL
  Filled 2017-01-05: qty 100

## 2017-01-05 MED ORDER — CLOPIDOGREL BISULFATE 75 MG PO TABS
75.0000 mg | ORAL_TABLET | Freq: Every day | ORAL | Status: DC
  Administered 2017-01-06: 75 mg via ORAL
  Filled 2017-01-05: qty 1

## 2017-01-05 NOTE — Progress Notes (Signed)
Progress Note  Patient Name: Jacob MoronHamish G Lesser Date of Encounter: 01/05/2017  Primary Cardiologist: No primary care provider on file.   Subjective   Continue to have productive cough. 3 episodes of chest pain last night, all lasted only seconds  Inpatient Medications    Scheduled Meds: . amLODipine  5 mg Oral Daily  . aspirin  81 mg Oral Daily  . atorvastatin  80 mg Oral q1800  . insulin aspart  0-15 Units Subcutaneous TID WC  . losartan  50 mg Oral Daily  . Melatonin  3 mg Oral QHS  . metoprolol tartrate  12.5 mg Oral BID  . sodium chloride flush  3 mL Intravenous Q12H  . ticagrelor  90 mg Oral BID   Continuous Infusions: . sodium chloride    . heparin 1,650 Units/hr (01/05/17 1001)   PRN Meds: sodium chloride, acetaminophen, guaiFENesin-dextromethorphan, HYDROmorphone (DILAUDID) injection, ondansetron (ZOFRAN) IV, sodium chloride flush   Vital Signs    Vitals:   01/04/17 2300 01/05/17 0045 01/05/17 0300 01/05/17 0833  BP: 131/90 (!) 142/98 133/81   Pulse: 92 89 91   Resp: 18 (!) 22 (!) 24   Temp: (!) 100.6 F (38.1 C)  (!) 101.5 F (38.6 C) (!) 100.5 F (38.1 C)  TempSrc: Axillary  Oral Oral  SpO2: 94% 96% 91%   Weight:      Height:        Intake/Output Summary (Last 24 hours) at 01/05/2017 1027 Last data filed at 01/04/2017 2300 Gross per 24 hour  Intake 1392 ml  Output 1250 ml  Net 142 ml   Filed Weights   01/03/17 1500 01/03/17 1519  Weight: 210 lb (95.3 kg) 210 lb (95.3 kg)    Telemetry    NSR without ventricular ectopy - Personally Reviewed  ECG    NSR with minimal ST elevation with TWI in inferior leads - Personally Reviewed  Physical Exam   GEN: No acute distress.   Neck: No JVD Cardiac: RRR, no murmurs, rubs, or gallops.  Respiratory: Clear to auscultation bilaterally. GI: Soft, nontender, non-distended  MS: No edema; No deformity. Neuro:  Nonfocal  Psych: Normal affect   Labs    Chemistry Recent Labs  Lab 01/03/17 1426  01/04/17 0224 01/05/17 0235  NA 136 131* 131*  K 3.8 3.8 3.8  CL 103 98* 99*  CO2 20* 22 20*  GLUCOSE 288* 253* 234*  BUN 12 10 12   CREATININE 0.66 0.69 0.72  CALCIUM 8.2* 8.4* 8.1*  PROT 6.0*  --   --   ALBUMIN 3.6  --   --   AST 128*  --   --   ALT 65*  --   --   ALKPHOS 41  --   --   BILITOT 1.1  --   --   GFRNONAA >60 >60 >60  GFRAA >60 >60 >60  ANIONGAP 13 11 12      Hematology Recent Labs  Lab 01/03/17 1426 01/04/17 0224 01/05/17 0235  WBC 8.8 7.6 5.9  RBC 4.83 4.62 4.73  HGB 16.0 14.8 15.3  HCT 44.4 42.8 43.8  MCV 91.9 92.6 92.6  MCH 33.1 32.0 32.3  MCHC 36.0 34.6 34.9  RDW 11.8 11.9 12.0  PLT 178 175 144*    Cardiac Enzymes Recent Labs  Lab 01/03/17 1426 01/03/17 2041 01/04/17 0224  TROPONINI 13.08* >65.00* 51.96*   No results for input(s): TROPIPOC in the last 168 hours.   BNPNo results for input(s): BNP, PROBNP in the last  168 hours.   DDimer No results for input(s): DDIMER in the last 168 hours.   Radiology    No results found.  Cardiac Studies   Cath 01/03/2017 Conclusion     Mid RCA lesion is 99% stenosed. Severely tortuous vessel.  A drug-eluting stent was successfully placed using a STENT SYNERGY DES 3X16.  Post intervention, there is a 0% residual stenosis.  Mid RCA to Dist RCA lesion is 90% stenosed. Likely thrombus embolized from the initial lesion.  Balloon angioplasty was performed using a BALLOON EMERGE MR 3.0X12. Due to 360 degree loop, it was very unlikely a stent would traverse through there.  Post intervention, there is a 10% residual stenosis.  There is moderate left ventricular systolic dysfunction.  The left ventricular ejection fraction is 35-45% by visual estimate.  LV end diastolic pressure is mildly elevated.  There is no aortic valve stenosis.   Continue IV tirofiban for 18 hours.  He will need dual antiplatelet therapy with aspirin and Brilinta.  He will need aggressive secondary prevention including  high-dose statin, smoking cessation, diabetes control.  Check echocardiogram to evaluate LV function and mitral valve function given the location of the infarct.  Given the thrombus burden, I would expect significant troponin elevation.    He will likely be in the hospital for 2-3 days.     Echo 01/04/2017 LV EF: 40% -   45%  ------------------------------------------------------------------- Indications:      MI - inferior wall - acute 410.41.  ------------------------------------------------------------------- History:   Risk factors:  Current tobacco use. Hypertension. Diabetes mellitus. Dyslipidemia.  ------------------------------------------------------------------- Study Conclusions  - Left ventricle: The cavity size was mildly dilated. Systolic   function was mildly to moderately reduced. The estimated ejection   fraction was in the range of 40% to 45%. There is severe   hypokinesis of the inferior myocardium. Features are consistent   with a pseudonormal left ventricular filling pattern, with   concomitant abnormal relaxation and increased filling pressure   (grade 2 diastolic dysfunction). - Left atrium: The atrium was mildly dilated. - Right ventricle: The cavity size was mildly dilated. Wall   thickness was normal. - Pulmonary arteries: Systolic pressure could not be accurately   estimated.     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  He does have a history of tobacco abuse, history of hypertension, hyperlipidemia and diabetes with hemoglobin A1c in the 10 range.  He was transferred for an inferior STEMI which Dr. Eldridge DaceVaranasi adenopathy performed via the right radial approach.  He had an occluded mid dominant RCA and had stenting with a drug-eluting stent.  His EF was in the 35-45% range with severe inferior hypokinesia.  He did have extensive distal RCA thrombus treated with Aggrastat for 18  hours.  He has no residual chest pain.  He does have a cough probably bronchitic.  He has a low-grade fever as well.  We will keep him on IV heparin for 48 hours and monitor him here in unit to H.  If we will need to consider switching from Brilinta to Plavix given the fact that he has been on Xarelto for DVT.   Assessment & Plan    1. Inferior STEMI  - DES to mid RCA, significant distal thrombus burden treated with aggrastat for 18 hrs. EF 35-45%  - Echo 01/04/2017 EF 40-45%, grade 2 DD.   - currently on asa and Brilinta. Consider change  to plavix given the need for triple therapy. D/C IV heparin today and restart Xarelto. Triple therapy for 1 month, then d/c ASA after that.   2. Productive cough with fever  - Tmax 101. consider CT of chest, although he had h/o DVT, however has been on Xarelto since at least Feb 2018, may not necessarily get CTA, but at least CT of chest without contrast to see if any intrapulmonary etiology to explain productive cough. He appears to be euvolemic. Reportedly had CXR at outside hospital.  3. H/o recurrent DVT on Xarelto: restart today after D/C IV heparin  - consider LE venous doppler. 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 HTN: increase metoprolol to 25mg  BID.   5. HLD: continue lipitor  6. DM II: SSI   For questions or updates, please contact [facility] Please consult [email or web address] for contact info under Cardiology/STEMI.      Ramond Dial, PA  01/05/2017, 10:27 AM

## 2017-01-05 NOTE — Progress Notes (Addendum)
ANTICOAGULATION CONSULT NOTE - Follow-Up Consult  Pharmacy Consult for Heparin Indication: RCA thrombus  No Known Allergies  Patient Measurements: Height: 5\' 8"  (172.7 cm) Weight: 210 lb (95.3 kg) IBW/kg (Calculated) : 68.4 Heparin Dosing Weight: 90 kg  Vital Signs: Temp: 101.5 F (38.6 C) (12/25 0300) Temp Source: Oral (12/25 0300) BP: 133/81 (12/25 0300) Pulse Rate: 91 (12/25 0300)  Labs: Recent Labs    01/03/17 1426 01/03/17 2041 01/04/17 0224 01/04/17 1040 01/04/17 1741 01/05/17 0235  HGB 16.0  --  14.8  --   --  15.3  HCT 44.4  --  42.8  --   --  43.8  PLT 178  --  175  --   --  144*  APTT >200*  --   --  40*  --   --   LABPROT 14.9  --   --   --   --   --   INR 1.18  --   --   --   --   --   HEPARINUNFRC  --   --   --  0.17* 0.33 0.37  CREATININE 0.66  --  0.69  --   --  0.72  TROPONINI 13.08* >65.00* 51.96*  --   --   --     Estimated Creatinine Clearance: 121 mL/min (by C-G formula based on SCr of 0.72 mg/dL).  Assessment: 51 y.o. male s/p STEMI who received DES to mid RCA. Heparin continued due to residual RCA thrombus. Was on aggrastat in combination (which has stopped after 18 hours).   Per cardiology notes, plan to continue heparin for 48 hours at which he will be transitioned back to Xarelto (last dose of Xarelto was on 12/22 in PM). Patient was started on Brilinta this admission for DES- per cardiology plan to transition back to Plavix to allow triple therapy for 1 month then stopping aspirin. Will likely need to reload with Plavix upon transitioning from Brilinta therapy.   Heparin level today came back at 0.37, within goal range - on lower end of goal, on 1600 units/hr. CBC remains stable, except platelets down slightly from 175 to 144. No issues with infusion per nursing. No signs/symptoms of bleeding.   Goal of Therapy:  Heparin level 0.3-0.7 units/ml Monitor platelets by anticoagulation protocol: Yes   Plan:  Increase heparin to 1650  units/hr Monitor daily heparin levels and CBC while on therapy F/u on plans from cardiology regarding transitioning with Xarelto and Plavix  Girard CooterKimberly Perkins, PharmD Clinical Pharmacist  Pager: [phone] Clinical Phone for 01/05/2017 until 3:30pm: [phone] If after 3:30pm, please call main pharmacy at [phone] 01/05/2017 8:03 AM  ADDENDUM Plan to transition from heparin infusion to Xarelto 20 mg. Would discontinue heparin infusion and give Xarelto at time of infusion discontinuation. Will monitor for signs/symptoms of bleeding given will be on triple therapy.  Girard CooterKimberly Perkins, PharmD Clinical Pharmacist

## 2017-01-06 DIAGNOSIS — Z955 Presence of coronary angioplasty implant and graft: Secondary | ICD-10-CM

## 2017-01-06 LAB — BASIC METABOLIC PANEL
ANION GAP: 11 (ref 5–15)
BUN: 12 mg/dL (ref 6–20)
CALCIUM: 8.1 mg/dL — AB (ref 8.9–10.3)
CO2: 21 mmol/L — ABNORMAL LOW (ref 22–32)
Chloride: 101 mmol/L (ref 101–111)
Creatinine, Ser: 0.63 mg/dL (ref 0.61–1.24)
GFR calc Af Amer: >60 mL/min (ref >60)
Glucose, Bld: 242 mg/dL — ABNORMAL HIGH (ref 65–99)
POTASSIUM: 3.6 mmol/L (ref 3.5–5.1)
SODIUM: 133 mmol/L — AB (ref 135–145)

## 2017-01-06 LAB — POCT I-STAT, CHEM 8
BUN: 13 mg/dL (ref 6–20)
CHLORIDE: 101 mmol/L (ref 101–111)
Calcium, Ion: 1.2 mmol/L (ref 1.15–1.40)
Creatinine, Ser: 0.4 mg/dL — ABNORMAL LOW (ref 0.61–1.24)
GLUCOSE: 346 mg/dL — AB (ref 65–99)
HEMATOCRIT: 48 % (ref 39.0–52.0)
Hemoglobin: 16.3 g/dL (ref 13.0–17.0)
Potassium: 3.8 mmol/L (ref 3.5–5.1)
Sodium: 137 mmol/L (ref 135–145)
TCO2: 23 mmol/L (ref 22–32)

## 2017-01-06 LAB — CBC
HCT: 47.4 % (ref 39.0–52.0)
Hemoglobin: 16.9 g/dL (ref 13.0–17.0)
MCH: 33 pg (ref 26.0–34.0)
MCHC: 35.7 g/dL (ref 30.0–36.0)
MCV: 92.6 fL (ref 78.0–100.0)
PLATELETS: 134 10*3/uL — AB (ref 150–400)
RBC: 5.12 MIL/uL (ref 4.22–5.81)
RDW: 12.2 % (ref 11.5–15.5)
WBC: 3.9 10*3/uL — AB (ref 4.0–10.5)

## 2017-01-06 LAB — GLUCOSE, CAPILLARY
GLUCOSE-CAPILLARY: 253 mg/dL — AB (ref 65–99)
GLUCOSE-CAPILLARY: 305 mg/dL — AB (ref 65–99)
GLUCOSE-CAPILLARY: 305 mg/dL — AB (ref 65–99)
Glucose-Capillary: 256 mg/dL — ABNORMAL HIGH (ref 65–99)
Glucose-Capillary: 234 mg/dL — ABNORMAL HIGH (ref 65–99)
Glucose-Capillary: 296 mg/dL — ABNORMAL HIGH (ref 65–99)

## 2017-01-06 LAB — POCT ACTIVATED CLOTTING TIME: ACTIVATED CLOTTING TIME: 125 s

## 2017-01-06 MED ORDER — INSULIN ASPART 100 UNIT/ML ~~LOC~~ SOLN
0.0000 [IU] | Freq: Every day | SUBCUTANEOUS | Status: DC
  Administered 2017-01-06: 4 [IU] via SUBCUTANEOUS

## 2017-01-06 MED ORDER — LIVING WELL WITH DIABETES BOOK
Freq: Once | Status: AC
  Filled 2017-01-06: qty 1

## 2017-01-06 MED ORDER — INSULIN ASPART 100 UNIT/ML ~~LOC~~ SOLN: 0.0000 [IU] | Freq: Three times a day (TID) | SUBCUTANEOUS | Status: DC

## 2017-01-06 MED ORDER — LIVING WELL WITH DIABETES BOOK
Freq: Once | Status: AC
  Administered 2017-01-06: 12:00:00
  Filled 2017-01-06: qty 1

## 2017-01-06 MED ORDER — OSELTAMIVIR PHOSPHATE 75 MG PO CAPS
75.0000 mg | ORAL_CAPSULE | Freq: Two times a day (BID) | ORAL | Status: DC
  Administered 2017-01-06 – 2017-01-07 (×3): 75 mg via ORAL
  Filled 2017-01-06 (×3): qty 1

## 2017-01-06 NOTE — Progress Notes (Signed)
CARDIAC REHAB PHASE I   PRE:  Rate/Rhythm: 70 SR    BP: sitting 139/89    SaO2: 97 RA  MODE:  Ambulation: 620 ft   POST:  Rate/Rhythm: 77 SR    BP: sitting 126/84     SaO2: 97 RA  Tolerated well, no c/o. Ed completed/reviewed with good reception. Many appropriate questions asked and answered. Understands importance of Plavix. [facility] to [facility]. Can walk independently. Eager to go home. Plans to quit pipe smoking. [id number]   Harriet MassonRandi Kristan Chelsea Pedretti CES, ACSM 01/06/2017 3:01 PM

## 2017-01-06 NOTE — Care Management Note (Addendum)
Case Management Note  Patient Details  Name: Jacob Cooper MRN: [id number] Date of Birth: 01/05/1966  Subjective/Objective:   From home, presents with STEMI, s/p PCI, DUE placed , had residual thrombus, and postive for flu, will be on Brilinta, NCM [facility] at 9:30.  No insurance lilsted. NCM gave patient , patient ast application for brilinta, brochure for [facility] for med ast, and brochure for patient care center, also gave him the 30 day free savings coupon.                  Action/Plan: NCM will follow for dc needs.  Expected Discharge Date:                  Expected Discharge Plan:  Home/Self Care  In-House Referral:     Discharge planning Services  CM Consult, Follow-up appt scheduled, Indigent Health Clinic, Medication Assistance  Post Acute Care Choice:    Choice offered to:     DME Arranged:    DME Agency:     HH Arranged:    HH Agency:     Status of Service:  In process, will continue to follow  If discussed at Long Length of Stay Meetings, dates discussed:    Additional Comments:  Jacob Cooper, Jacob Rasmusson Clinton, RN 01/06/2017, 10:57 AM

## 2017-01-06 NOTE — Progress Notes (Addendum)
Inpatient Diabetes Program Recommendations  AACE/ADA: New Consensus Statement on Inpatient Glycemic Control (2015)  Target Ranges:  Prepandial:   less than 140 mg/dL      Peak postprandial:   less than 180 mg/dL (1-2 hours)      Critically ill patients:  140 - 180 mg/dL   Lab Results  Component Value Date   GLUCAP 234 (H) 01/06/2017   HGBA1C 10.4 (H) 01/03/2017    Review of Glycemic ControlResults for Duanne MoronBELL, Masayoshi G (MRN [id number]) as of 01/06/2017 11:31  Ref. Range 01/05/2017 08:29 01/05/2017 12:03 01/05/2017 16:31 01/05/2017 21:57 01/06/2017 07:41  Glucose-Capillary Latest Ref Range: 65 - 99 mg/dL 696232 (H) 295280 (H) 284252 (H) 249 (H) 234 (H)    Diabetes history: Type 2 DM Outpatient Diabetes medications: Glucotrol 5 mg daily, Metformin 500 mg bid Current orders for Inpatient glycemic control:  Novolog resistant tid with meals  Inpatient Diabetes Program Recommendations:    A1C indicates that average blood sugars approximately 252 mg/dL.  Please consider adding Lantus 20 units daily while in the hospital.  Will discuss A1C with patient.  He will likely need adjustment in outpatient DM medications upon d/c as well.   Thanks,  Beryl MeagerJenny Bryli Mantey, RN, BC-ADM Inpatient Diabetes Coordinator Pager [phone] ([phone])   1530- Spoke with patient regarding DM management.  He admits that he ran out of DM medications recently.  He now has a new MD and plans to f/u with Dr. Fransico [place]Nida in [place] for DM management.  We briefly discussed A1C, goal blood sugars, eliminating sugar from beverages and complications.  Gave him handout on A1C and DM standards of care.

## 2017-01-06 NOTE — Progress Notes (Signed)
Progress Note  Patient Name: Jacob Cooper Date of Encounter: 01/06/2017  Primary Cardiologist: Eldridge DaceVaranasi   Subjective   Mr. Alvester MorinBell is a 51 year old mildly overweight married Caucasian male who was admitted 01/03/17 with an inferior STEMI treated with PCI and drug-eluting stenting of the mid RCA by Dr. Eldridge DaceVaranasi .  He had residual thrombus burden in the distal RCA and was placed on Aggrastat for 18 hours as well as heparin.  The procedure was performed radially.  He has had occasional intermittent atypical chest pain and persistent coughing.  He has tested positive for influenza A.  A chest CT was negative for PE.  His fever curve has improved.  Inpatient Medications    Scheduled Meds: . amLODipine  5 mg Oral Daily  . aspirin  81 mg Oral Daily  . atorvastatin  80 mg Oral q1800  . clopidogrel  75 mg Oral Daily  . insulin aspart  0-20 Units Subcutaneous TID WC  . losartan  50 mg Oral Daily  . Melatonin  3 mg Oral QHS  . metoprolol tartrate  25 mg Oral BID  . rivaroxaban  20 mg Oral Q supper  . sodium chloride flush  3 mL Intravenous Q12H   Continuous Infusions: . sodium chloride     PRN Meds: sodium chloride, acetaminophen, guaiFENesin-dextromethorphan, HYDROmorphone (DILAUDID) injection, ondansetron (ZOFRAN) IV, sodium chloride flush   Vital Signs    Vitals:   01/05/17 2000 01/06/17 0000 01/06/17 0400 01/06/17 0700  BP: 122/86 101/68 109/80   Pulse: 85 73 78   Resp: 18 16 18    Temp: (!) 100.6 F (38.1 C) 99.8 F (37.7 C) 99.4 F (37.4 C) 98.5 F (36.9 C)  TempSrc: Oral Oral Oral Axillary  SpO2: 93% 91% 93%   Weight:      Height:        Intake/Output Summary (Last 24 hours) at 01/06/2017 0818 Last data filed at 01/05/2017 2000 Gross per 24 hour  Intake 240 ml  Output -  Net 240 ml   Filed Weights   01/03/17 1500 01/03/17 1519  Weight: 210 lb (95.3 kg) 210 lb (95.3 kg)    Telemetry    Sinus rhythm- Personally Reviewed  ECG    Normal sinus rhythm with  inferior Q waves and inferior T wave inversion at 92.- Personally Reviewed  Physical Exam   GEN: No acute distress.   Neck: No JVD Cardiac: RRR, no murmurs, rubs, or gallops.  Respiratory: Clear to auscultation bilaterally. GI: Soft, nontender, non-distended  MS: No edema; No deformity. Neuro:  Nonfocal  Psych: Normal affect  Extremity- right radial puncture site intact  Labs    Chemistry Recent Labs  Lab 01/03/17 1426 01/04/17 0224 01/05/17 0235  NA 136 131* 131*  K 3.8 3.8 3.8  CL 103 98* 99*  CO2 20* 22 20*  GLUCOSE 288* 253* 234*  BUN 12 10 12   CREATININE 0.66 0.69 0.72  CALCIUM 8.2* 8.4* 8.1*  PROT 6.0*  --   --   ALBUMIN 3.6  --   --   AST 128*  --   --   ALT 65*  --   --   ALKPHOS 41  --   --   BILITOT 1.1  --   --   GFRNONAA >60 >60 >60  GFRAA >60 >60 >60  ANIONGAP 13 11 12      Hematology Recent Labs  Lab 01/03/17 1426 01/04/17 0224 01/05/17 0235  WBC 8.8 7.6 5.9  RBC 4.83 4.62 4.73  HGB 16.0 14.8 15.3  HCT 44.4 42.8 43.8  MCV 91.9 92.6 92.6  MCH 33.1 32.0 32.3  MCHC 36.0 34.6 34.9  RDW 11.8 11.9 12.0  PLT 178 175 144*    Cardiac Enzymes Recent Labs  Lab 01/03/17 1426 01/03/17 2041 01/04/17 0224  TROPONINI 13.08* >65.00* 51.96*   No results for input(s): TROPIPOC in the last 168 hours.   BNPNo results for input(s): BNP, PROBNP in the last 168 hours.   DDimer No results for input(s): DDIMER in the last 168 hours.   Radiology    Ct Angio Chest Pe W Or Wo Contrast  Result Date: 01/05/2017 CLINICAL DATA:  Recurrent DVT.  Shortness of breath. EXAM: CT ANGIOGRAPHY CHEST WITH CONTRAST TECHNIQUE: Multidetector CT imaging of the chest was performed using the standard protocol during bolus administration of intravenous contrast. Multiplanar CT image reconstructions and MIPs were obtained to evaluate the vascular anatomy. CONTRAST:  ISOVUE-370 IOPAMIDOL (ISOVUE-370) INJECTION 76% COMPARISON:  None. FINDINGS: Cardiovascular: Satisfactory  opacification of the pulmonary arteries to the segmental level. No evidence of pulmonary embolism when allowing for areas of motion artifact. Mild cardiomegaly. Atherosclerotic calcification in the left and right coronary circulation. Limited opacification of the systemic arterial tree. No acute aortic finding. Mediastinum/Nodes: Negative for adenopathy or mass. Lungs/Pleura: There is no edema, consolidation, effusion, or pneumothorax. 5 mm average diameter left lower lobe pulmonary nodule. Borderline airway thickening. Upper Abdomen: Prominent hepatic steatosis. Musculoskeletal: No acute finding Review of the MIP images confirms the above findings. IMPRESSION: 1. Negative for pulmonary embolism or other acute finding. 2. Coronary atherosclerosis. 3. 5 mm left lower lobe pulmonary nodule. Non-contrast chest CT can be considered in 12 months in this smoker. This recommendation follows the consensus statement: Guidelines for Management of Incidental Pulmonary Nodules Detected on CT Images: From the Fleischner Society 2017; Radiology 2017; 284:228-243. 4. Prominent hepatic steatosis. Electronically Signed   By: Marnee Spring M.D.   On: 01/05/2017 16:31    Cardiac Studies   Cardiac catheterization/intervention (01/03/17)  Conclusion     Mid RCA lesion is 99% stenosed. Severely tortuous vessel.  A drug-eluting stent was successfully placed using a STENT SYNERGY DES 3X16.  Post intervention, there is a 0% residual stenosis.  Mid RCA to Dist RCA lesion is 90% stenosed. Likely thrombus embolized from the initial lesion.  Balloon angioplasty was performed using a BALLOON EMERGE MR 3.0X12. Due to 360 degree loop, it was very unlikely a stent would traverse through there.  Post intervention, there is a 10% residual stenosis.  There is moderate left ventricular systolic dysfunction.  The left ventricular ejection fraction is 35-45% by visual estimate.  LV end diastolic pressure is mildly  elevated.  There is no aortic valve stenosis.   Continue IV tirofiban for 18 hours.  He will need dual antiplatelet therapy with aspirin and Brilinta.  He will need aggressive secondary prevention including high-dose statin, smoking cessation, diabetes control.  Check echocardiogram to evaluate LV function and mitral valve function given the location of the infarct.  Given the thrombus burden, I would expect significant troponin elevation.    He will likely be in the hospital for 2-3 days.     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  He does have a history of tobacco abuse, history of hypertension, hyperlipidemia and diabetes with hemoglobin A1c in the 10 range.  He was transferred for an inferior STEMI which Dr.  Varanasi adenopathy performed via the right radial approach on 01/03/17.  He had an occluded mid dominant RCA and had stenting with a drug-eluting stent.  His EF was in the 35-45% range with severe inferior hypokinesia.  He did have extensive distal RCA thrombus treated with Aggrastat for 18 hours.  He has had occasional off and on chest pain which is somewhat atypical.   He does have a cough probably bronchitic.  He has a low-grade fever as well.  He was switched to Plavix because of the need to be on "triple therapy" with Xarelto because of prior DVT.  A chest CT was negative for PE.  He did culture positive for influenza A.  Assessment & Plan    1: Inferior STEMI-postop day #3 inferior STEMI from occlusion of a mid dominant RCA treated with PCI and drug-eluting stenting.  This is a  The procedure was performed radially.  The radial puncture site appears intact.  The patient is on low-dose aspirin and Brilinta.  He was on Xarelto as an outpatient for DVT.  We transitioned him to Plavix so that he can be on triple therapy for 1 month after which we can stop the aspirin.  He will need lifestyle modification including smoking  cessation and more aggressive treatment of his diabetes and hyperlipidemia.  2: Essential hypertension-his blood pressure is moderately elevated today.  He was begun on low-dose beta-blocker.  He was on losartan and amlodipine at home which I will restart and we will continue to monitor.  3: Hyperlipidemia- started on high-dose statin  4: Type 2 diabetes-on insulin  5: Influenza A-culture positive and begun on Tamiflu.  He is on call Lyphocin and Robitussin for cough.  His fever curve has improved.  Okay to transfer to stepdown.  He did culture positive for influenza A and was begun on Tamiflu.  Cardiac rehab consult.  Probably home towards the end of the week.  He was transitioned to Plavix from Brilinta.  For questions or updates, please contact [facility] Please consult [email or web address] for contact info under Cardiology/STEMI.      Signed, Nanetta BattyJonathan Blonnie Maske, MD  01/06/2017, 8:18 AM

## 2017-01-07 ENCOUNTER — Encounter (HOSPITAL_COMMUNITY): Payer: Self-pay | Admitting: Cardiology

## 2017-01-07 ENCOUNTER — Telehealth: Payer: Self-pay | Admitting: Interventional Cardiology

## 2017-01-07 DIAGNOSIS — I82401 Acute embolism and thrombosis of unspecified deep veins of right lower extremity: Secondary | ICD-10-CM

## 2017-01-07 DIAGNOSIS — E7800 Pure hypercholesterolemia, unspecified: Secondary | ICD-10-CM

## 2017-01-07 LAB — CBC
HEMATOCRIT: 45.2 % (ref 39.0–52.0)
HEMOGLOBIN: 16.1 g/dL (ref 13.0–17.0)
MCH: 33 pg (ref 26.0–34.0)
MCHC: 35.6 g/dL (ref 30.0–36.0)
MCV: 92.6 fL (ref 78.0–100.0)
Platelets: 136 10*3/uL — ABNORMAL LOW (ref 150–400)
RBC: 4.88 MIL/uL (ref 4.22–5.81)
RDW: 12.3 % (ref 11.5–15.5)
WBC: 4.5 10*3/uL (ref 4.0–10.5)

## 2017-01-07 LAB — GLUCOSE, CAPILLARY: Glucose-Capillary: 272 mg/dL — ABNORMAL HIGH (ref 65–99)

## 2017-01-07 LAB — BASIC METABOLIC PANEL
ANION GAP: 10 (ref 5–15)
BUN: 12 mg/dL (ref 6–20)
CHLORIDE: 101 mmol/L (ref 101–111)
CO2: 23 mmol/L (ref 22–32)
Calcium: 8.1 mg/dL — ABNORMAL LOW (ref 8.9–10.3)
Creatinine, Ser: 0.68 mg/dL (ref 0.61–1.24)
GFR calc non Af Amer: >60 mL/min (ref >60)
Glucose, Bld: 263 mg/dL — ABNORMAL HIGH (ref 65–99)
POTASSIUM: 3.7 mmol/L (ref 3.5–5.1)
SODIUM: 134 mmol/L — AB (ref 135–145)

## 2017-01-07 MED ORDER — OSELTAMIVIR PHOSPHATE 75 MG PO CAPS: 75.0000 mg | ORAL_CAPSULE | Freq: Two times a day (BID) | ORAL | 0 refills | Status: DC

## 2017-01-07 MED ORDER — ASPIRIN 81 MG PO CHEW: 81.0000 mg | CHEWABLE_TABLET | Freq: Every day | ORAL | Status: DC

## 2017-01-07 MED ORDER — ATORVASTATIN CALCIUM 80 MG PO TABS: 80.0000 mg | ORAL_TABLET | Freq: Every day | ORAL | 0 refills | Status: DC

## 2017-01-07 MED ORDER — PANTOPRAZOLE SODIUM 40 MG PO TBEC: 40.0000 mg | DELAYED_RELEASE_TABLET | Freq: Every day | ORAL | Status: DC

## 2017-01-07 MED ORDER — NITROGLYCERIN 0.4 MG SL SUBL: 0.4000 mg | SUBLINGUAL_TABLET | SUBLINGUAL | 3 refills | Status: DC | PRN

## 2017-01-07 MED ORDER — METOPROLOL TARTRATE 25 MG PO TABS: 25.0000 mg | ORAL_TABLET | Freq: Two times a day (BID) | ORAL | 0 refills | Status: DC

## 2017-01-07 MED ORDER — CLOPIDOGREL BISULFATE 75 MG PO TABS
75.0000 mg | ORAL_TABLET | Freq: Every day | ORAL | 0 refills | Status: DC
Start: 2017-01-07 — End: 2017-02-19

## 2017-01-07 MED ORDER — AMLODIPINE BESYLATE 5 MG PO TABS: 5.0000 mg | ORAL_TABLET | Freq: Every day | ORAL | 0 refills | Status: DC

## 2017-01-07 NOTE — Progress Notes (Signed)
Pt discharged per MD order, all discharge instructions reviewed and all questions answered.  

## 2017-01-07 NOTE — Care Management Note (Signed)
Case Management Note  Patient Details  Name: Jacob Cooper MRN: [id number] Date of Birth: 01/01/1966  Subjective/Objective:   From home, presents with STEMI, s/p PCI, DUE placed , had residual thrombus, and postive for flu, will be on Brilinta, NCM [facility] at 9:30.  No insurance lilsted. NCM gave patient , patient ast application for brilinta, brochure for [facility] for med ast, and brochure for patient [facility], also gave him the 30 day free savings coupon.                  Action/Plan: NCM will follow for dc needs.  Expected Discharge Date:  01/07/17               Expected Discharge Plan:  Home/Self Care  In-House Referral:     Discharge planning Services  CM Consult, Follow-up appt scheduled, [facility]igent [facility] Clinic, Medication Assistance, [facility]  Post Acute Care Choice:    Choice offered to:     DME Arranged:    DME Agency:     HH Arranged:    HH Agency:     Status of Service:  Completed, signed off  If discussed at [employer] Meetings, dates discussed:    Additional Comments:  01/07/17 J. Laszlo Ellerby, RN, BSN Pt medically stable for discharge home today with family.  Pt is not discharging home on Brilinta; plan is for Plavix and ASA instead.    Pt is uninsured, but is eligible for medication [facility]. [facility] given with explanation of program benefits.   Pt appreciative of all help given.    Quintella BatonJulie W. Masie Bermingham, RN, BSN  Trauma/Neuro ICU Case Manager [phone]

## 2017-01-07 NOTE — Progress Notes (Signed)
Progress Note  Patient Name: Jacob Cooper Date of Encounter: 01/07/2017  Primary Cardiologist: Eldridge DaceVaranasi   Subjective   Mr. Alvester MorinBell is a 51 year old mildly overweight married Caucasian male who was admitted 01/03/17 with an inferior STEMI treated with PCI and drug-eluting stenting of the mid RCA by Dr. Eldridge DaceVaranasi .  He had residual thrombus burden in the distal RCA and was placed on Aggrastat for 18 hours as well as heparin.  The procedure was performed radially.  He was having occasional intermittent atypical chest pain and persistent coughing which has since resolved..  He has tested positive for influenza A and was placed on Tamiflu.  A chest CT was negative for PE.  His fever curve has improved.  Inpatient Medications    Scheduled Meds: . amLODipine  5 mg Oral Daily  . aspirin  81 mg Oral Daily  . atorvastatin  80 mg Oral q1800  . clopidogrel  75 mg Oral Daily  . insulin aspart  0-20 Units Subcutaneous TID WC  . insulin aspart  0-5 Units Subcutaneous QHS  . losartan  50 mg Oral Daily  . Melatonin  3 mg Oral QHS  . metoprolol tartrate  25 mg Oral BID  . oseltamivir  75 mg Oral BID  . pantoprazole  40 mg Oral Daily  . rivaroxaban  20 mg Oral Q supper  . sodium chloride flush  3 mL Intravenous Q12H   Continuous Infusions: . sodium chloride     PRN Meds: sodium chloride, acetaminophen, guaiFENesin-dextromethorphan, HYDROmorphone (DILAUDID) injection, ondansetron (ZOFRAN) IV, sodium chloride flush   Vital Signs    Vitals:   01/06/17 2223 01/06/17 2306 01/07/17 0422 01/07/17 0744  BP: 110/72 (!) 137/94 110/81 (!) 125/93  Pulse:  70 72 76  Resp:  20 20 15   Temp:  (!) 97.2 F (36.2 C) 98.8 F (37.1 C) (!) 97.5 F (36.4 C)  TempSrc:  Axillary Oral Oral  SpO2:  93% 96% 96%  Weight:      Height:        Intake/Output Summary (Last 24 hours) at 01/07/2017 0856 Last data filed at 01/07/2017 0400 Gross per 24 hour  Intake 603 ml  Output -  Net 603 ml   Filed Weights   01/03/17 1500 01/03/17 1519  Weight: 210 lb (95.3 kg) 210 lb (95.3 kg)    Telemetry    Sinus rhythm- Personally Reviewed  ECG    Not performed today- Personally Reviewed  Physical Exam   GEN: No acute distress.   Neck: No JVD Cardiac: RRR, no murmurs, rubs, or gallops.  Respiratory: Clear to auscultation bilaterally. GI: Soft, nontender, non-distended  MS: No edema; No deformity. Neuro:  Nonfocal  Psych: Normal affect  Extremity- right radial puncture site intact  Labs    Chemistry Recent Labs  Lab 01/03/17 1426  01/05/17 0235 01/06/17 0703 01/07/17 0224  NA 136   < > 131* 133* 134*  K 3.8   < > 3.8 3.6 3.7  CL 103   < > 99* 101 101  CO2 20*   < > 20* 21* 23  GLUCOSE 288*   < > 234* 242* 263*  BUN 12   < > 12 12 12   CREATININE 0.66   < > 0.72 0.63 0.68  CALCIUM 8.2*   < > 8.1* 8.1* 8.1*  PROT 6.0*  --   --   --   --   ALBUMIN 3.6  --   --   --   --  AST 128*  --   --   --   --   ALT 65*  --   --   --   --   ALKPHOS 41  --   --   --   --   BILITOT 1.1  --   --   --   --   GFRNONAA >60   < > >60 >60 >60  GFRAA >60   < > >60 >60 >60  ANIONGAP 13   < > 12 11 10    < > = values in this interval not displayed.     Hematology Recent Labs  Lab 01/05/17 0235 01/06/17 0703 01/07/17 0224  WBC 5.9 3.9* 4.5  RBC 4.73 5.12 4.88  HGB 15.3 16.9 16.1  HCT 43.8 47.4 45.2  MCV 92.6 92.6 92.6  MCH 32.3 33.0 33.0  MCHC 34.9 35.7 35.6  RDW 12.0 12.2 12.3  PLT 144* 134* 136*    Cardiac Enzymes Recent Labs  Lab 01/03/17 1426 01/03/17 2041 01/04/17 0224  TROPONINI 13.08* >65.00* 51.96*   No results for input(s): TROPIPOC in the last 168 hours.   BNPNo results for input(s): BNP, PROBNP in the last 168 hours.   DDimer No results for input(s): DDIMER in the last 168 hours.   Radiology    Ct Angio Chest Pe W Or Wo Contrast  Result Date: 01/05/2017 CLINICAL DATA:  Recurrent DVT.  Shortness of breath. EXAM: CT ANGIOGRAPHY CHEST WITH CONTRAST TECHNIQUE:  Multidetector CT imaging of the chest was performed using the standard protocol during bolus administration of intravenous contrast. Multiplanar CT image reconstructions and MIPs were obtained to evaluate the vascular anatomy. CONTRAST:  <MEAPrimary Enterprise<MEASUErvin KnAlejKentuckyandrRandie Vanessa DuRussell CoPrimary [place]<MEASUErvin KnAlejKentuckyandr(Randie Vanessa DuAdvanced Surgery Center Of Central IowarChi<MEASUREMENHospital docAscension-Al46l EulaCherly HenErvin KnAlejKentuckVanesPrimary [place]<MEASUErvin KnAlejKentuckyanRandie VanessaPrimary [place]<MEASUErvin KnAlejKentuckyandrRandie Vanessa DuSyracuse SurgPrimary [place]<MEASUErvin KnAlejKentuckyandrRandie Vanessa DuMary LanningPrimary [place]<MEASUErvin KnAlejKentuckyandrRandie Vanessa DuEye Surgery CentePrimary [place]<MEASUErvin KnAlejKentuckyandrRandie VanesPrimary [place]<MEASUErvin KnAlejKentuckyandr(Randie Vanessa DuMcdoPrimary [place]<MEASUErvin KnAlejKentuckyandrRandie Vanessa DuMoye Medical Endoscopy Center Primary [place]<MEASUREMEulaCErvin KnAlejKentuckyandRandie Vanessa DuGlbesc LLC Dba Memorialcare Outpatient SPrimary [place]<MEASUErvin KnAlejKentuckyandrRandie VanePrimary [place]<MEASUErvin KnAlejKentuckyandr(Randie Vanessa DuProliance Center For Outpatient Spine And Joint Replacement SurgePrimary [place]<MEASUErvin KnAlejKentuckyandr(Randie Vanessa DuValleycare Medical CenterrChi<MEASUREMENHosPrimary [place]<MEASUErvin KnAlejKentuckyandrRandie Vanessa DuCommunity RegionPrimary [place]<MEASUErvin KnAlejKentuckyandrRandie Vanessa DuCastle Rock SurgicePrimary [place]<MEASUErvin KnAlejKentuckyandrRandie Vanessa DuBayfront Health Spring HiPrimary [place]<MEASUErvin KnAlejKentuckyandr(Randie Vanessa DuVa North [facility] - GaiPrimary [place]<MEASUErvin KnAlejKentuckyanRandie Vanessa DuIsland HospitalrChi<MEASUREMENHospital docUpmc Mc32keEulaCherly HenErvin KnAlejKentuckyaVanessa DuChildren'S Hospital [facility] At Parker AdvePrimary [place]<MEASUErvin KnAlejKentuckyanRandie Vanessa DuEast Adams Rural HospPrimary [place]<MEASUErvin KnAlejKentuckyandrRandie Vanessa DuPioneers Medical CenterrChi<MEAPrimary [place]<MEASUErvin KnAlejKentuckyandr(Randie Vanessa DuLewisgPrimary [place]<MEASUErvin KnAlejKentuckyandrRandie Vanessa DuLexington Primary [place]<MEASUErvin KnAlejKentuckyandr(5Randie Vanessa DuHarford Primary <MEASUErvin KnAlejKentuckyandr(9Randie Vanessa DuCentro Cardiovascular De Pr Y Caribe Dr Ramon M SuarezrChinitGeralyn Corwini, hospitalUREMENHospital docPhysicians Surgical [id number] Gree(4 sfactory opacification of the pulmonary arteries to the segmental level. No evidence of pulmonary embolism when allowing for areas of motion artifact. Mild cardiomegaly. Atherosclerotic calcification in the left and right coronary circulation. Limited opacification of the systemic arterial tree. No acute aortic finding. Mediastinum/Nodes: Negative for adenopathy or mass. Lungs/Pleura: There is no edema, consolidation, effusion, or pneumothorax. 5 mm average diameter left lower lobe pulmonary nodule. Borderline airway thickening. Upper Abdomen: Prominent hepatic steatosis. Musculoskeletal: No acute finding Review of the MIP images confirms the above findings. IMPRESSION: 1. Negative for pulmonary embolism or other acute finding. 2. Coronary atherosclerosis. 3. 5 mm left lower lobe pulmonary nodule. Non-contrast chest CT can be considered in 12 months in this smoker. This recommendation follows the consensus statement: Guidelines for Management of Incidental Pulmonary Nodules Detected on CT Images: From the Fleischner Society 2017; Radiology 2017; 284:228-243. 4. Prominent hepatic steatosis. Electronically Signed   By: Jonathon  Watts M.D.   On: 01/05/2017 16:31    Cardiac Studies   Cardiac catheterization/intervention (01/03/17)  Conclusion     Mid RCA lesion is 99% stenosed. Severely tortuous vessel.  A drug-eluting stent was successfully placed using a STENT SYNERGY DES 3X16.  Post intervention, there is a 0% residual stenosis.  Mid RCA to Dist RCA lesion is 90% stenosed. Likely thrombus embolized from the initial lesion.  Balloon angioplasty was performed  using a BALLOON EMERGE MR 3.0X12. Due to 360 degree loop, it was very unlikely a stent would traverse through there.  Post intervention, there is a 10% residual stenosis.  There is moderate left ventricular systolic dysfunction.  The left ventricular ejection fraction is 35-45% by visual estimate.  LV end diastolic pressure is mildly elevated.  There is no aortic valve stenosis.   Continue IV  tirofiban for 18 hours.  He will need dual antiplatelet therapy with aspirin and Brilinta.  He will need aggressive secondary prevention including high-dose statin, smoking cessation, diabetes control.  Check echocardiogram to evaluate LV function and mitral valve function given the location of the infarct.  Given the thrombus burden, I would expect significant troponin elevation.    He will likely be in the hospital for 2-3 days.   2D echocardiogram (01/04/17)  Study Conclusions  - Left ventricle: The cavity size was mildly dilated. Systolic   function was mildly to moderately reduced. The estimated ejection   fraction was in the range of 40% to 45%. There is severe   hypokinesis of the inferior myocardium. Features are consistent   with a pseudonormal left ventricular filling pattern, with   concomitant abnormal relaxation and increased filling pressure   (grade 2 diastolic dysfunction). - Left atrium: The atrium was mildly dilated. - Right ventricle: The cavity size was mildly dilated. Wall   thickness was normal. - Pulmonary arteries: Systolic pressure could not be accurately   estimated.  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  He does have a history of tobacco abuse, history of hypertension, hyperlipidemia and diabetes with hemoglobin A1c in the 10 range.  He was transferred for an inferior STEMI which Dr. Eldridge Dace adenopathy performed via the right radial approach on 01/03/17.  He had an occluded mid dominant RCA and  had stenting with a drug-eluting stent.  His EF was in the 35-45% range with severe inferior hypokinesia.  He did have extensive distal RCA thrombus treated with Aggrastat for 18 hours.  He has had occasional off and on chest pain which is somewhat atypical.   He does have a cough probably bronchitic.  He has a low-grade fever as well.  He was switched to Plavix because of the need to be on "triple therapy" with Xarelto because of prior DVT.  A chest CT was negative for PE.  He did culture positive for influenza A.  He was begun on Tamiflu with improvement in his symptoms.  Assessment & Plan    1: Inferior STEMI-postop day #5 inferior STEMI from occlusion of a mid dominant RCA treated with PCI and drug-eluting stenting.  This is a  The procedure was performed radially.  The radial puncture site appears intact.  The patient is on low-dose aspirin and Plavix.  He was on Xarelto as an outpatient for DVT.  He has been transitioned him to Plavix so that he can be on triple therapy for 1 month after which we can stop the aspirin.  He will need lifestyle modification including smoking cessation and more aggressive treatment of his diabetes and hyperlipidemia.  2: Essential hypertension-his blood pressure is moderately elevated today.  He was begun on low-dose beta-blocker.  He was on losartan and amlodipine at home which I will restart and we will continue to monitor.  3: Hyperlipidemia- started on high-dose statin  4: Type 2 diabetes-on insulin  5: Influenza A-culture positive and begun on Tamiflu.  He is on call Lyphocin and Robitussin for cough.  His fever curve has improved.  6: Ischemic cardiomyopathy- EF in the 40-45% range.  He is on appropriate medications including ARB and beta-blocker.  We will recheck a 2D echocardiogram in 3 months.   He did culture positive for influenza A and was begun on Tamiflu.  Cardiac rehab consult.  He is feeling  better since beginning Tamiflu.  His flu symptoms have  resolved.  He denies chest pain or shortness of breath.  He is ambulating the hallway without limitation.  He stable for discharge today.  We will get case management to discuss medication assistance given the recent loss of his wife's job and insurance issues.  We talked about lifestyle modification including smoking cessation, diet and exercise.  Will follow up with Dr. Eldridge Dace as an outpatient.   For questions or updates, please contact [facility] Please consult [email or web address] for contact info under Cardiology/STEMI.      Signed, Nanetta Batty, MD  01/07/2017, 8:56 AM

## 2017-01-07 NOTE — Progress Notes (Signed)
Pt CBG 305, No HS insulin coverage ordered. MD paged. Verbal order received with read back from on call MD Verde to add pt moderate scale HS coverage. Will continue to monitor.

## 2017-01-07 NOTE — Telephone Encounter (Signed)
New Message      [facility] 930a

## 2017-01-07 NOTE — Discharge Summary (Signed)
Discharge Summary    Patient ID: Jacob Cooper,  MRN: [id number], DOB/AGE: Dec 16, 1965 51 y.o.  Admit date: 01/03/2017 Discharge date: 01/07/2017  Primary Care Provider: Wilson Singer Primary Cardiologist: Eldridge Dace   Discharge Diagnoses    Active Problems:   Acute MI, inferior wall (HCC)   Acute inferior myocardial infarction [facility])   Status post coronary artery stent placement   Fever   SOB (shortness of breath)   Deep vein thrombosis (DVT) of lower extremity (HCC)   Essential hypertension   Allergies No Known Allergies  Diagnostic Studies/Procedures    Cath: 01/03/17  Conclusion     Mid RCA lesion is 99% stenosed. Severely tortuous vessel.  A drug-eluting stent was successfully placed using a STENT SYNERGY DES 3X16.  Post intervention, there is a 0% residual stenosis.  Mid RCA to Dist RCA lesion is 90% stenosed. Likely thrombus embolized from the initial lesion.  Balloon angioplasty was performed using a BALLOON EMERGE MR 3.0X12. Due to 360 degree loop, it was very unlikely a stent would traverse through there.  Post intervention, there is a 10% residual stenosis.  There is moderate left ventricular systolic dysfunction.  The left ventricular ejection fraction is 35-45% by visual estimate.  LV end diastolic pressure is mildly elevated.  There is no aortic valve stenosis.   Continue IV tirofiban for 18 hours.  He will need dual antiplatelet therapy with aspirin and Brilinta.  He will need aggressive secondary prevention including high-dose statin, smoking cessation, diabetes control.  Check echocardiogram to evaluate LV function and mitral valve function given the location of the infarct.  Given the thrombus burden, I would expect significant troponin elevation.    He will likely be in the hospital for 2-3 days.    TTE: 01/04/17  Study Conclusions  - Left ventricle: The cavity size was mildly dilated. Systolic   function was mildly to  moderately reduced. The estimated ejection   fraction was in the range of 40% to 45%. There is severe   hypokinesis of the inferior myocardium. Features are consistent   with a pseudonormal left ventricular filling pattern, with   concomitant abnormal relaxation and increased filling pressure   (grade 2 diastolic dysfunction). - Left atrium: The atrium was mildly dilated. - Right ventricle: The cavity size was mildly dilated. Wall   thickness was normal. - Pulmonary arteries: Systolic pressure could not be accurately   estimated. _____________   History of Present Illness     51 y/o man with DM and HTN who had chest pain the day prior to admission and went to the ER.  He had a negative w/u in the ER but refused admission per the ER MD.  He came back to the ER the following day with severe CP and initial ECG showed inferior ST elevation. Cardiology was consulted for management of his STEMI.    He had for episodes intermittently over the last 2 days prior to admission of severe chest discomfort.  He continuef to have chest discomfort upon arrival to the Cath Lab. He had 8 mg of morphine for pain control and transport from [facility] Course     Underwent cardiac cath with Dr. Eldridge Dace noted above with occlusion of a mid dominant RCA treated with PCI and drug-eluting stenting. Plan for low-dose aspirin and Plavix.  He was on Xarelto as an outpatient for DVT.  He was placed on Plavix so that he can be on triple therapy for 1  month after which we can stop the aspirin.  He needs lifestyle modification including smoking cessation and more aggressive treatment of his diabetes and hyperlipidemia. Troponin peaked at >65. Trig 472, and LDL was unable to be calculated. He was placed on high dose statin. He was also started on low dose BB, along with his ARB and amlodipine. He worked well with cardiac rehab and had no further chest pain. Did develop a fever post cath and tested positive for  Influenza and started in Tamiflu. Seen by CM and set up for PCP appt and [facility].    Frederico G Kouba was seen by Dr. Allyson SabalBerry and determined stable for discharge home. Follow up in the office has been arranged. Medications are listed below.   _____________  Discharge Vitals Blood pressure (!) 125/93, pulse 76, temperature (!) 97.5 F (36.4 C), temperature source Oral, resp. rate 15, height 5\' 8"  (1.727 m), weight 210 lb (95.3 kg), SpO2 96 %.  Filed Weights   01/03/17 1500 01/03/17 1519  Weight: 210 lb (95.3 kg) 210 lb (95.3 kg)    Labs & Radiologic Studies    CBC Recent Labs    01/06/17 0703 01/07/17 0224  WBC 3.9* 4.5  HGB 16.9 16.1  HCT 47.4 45.2  MCV 92.6 92.6  PLT 134* 136*   Basic Metabolic Panel Recent Labs    40/98/1112/26/18 0703 01/07/17 0224  NA 133* 134*  K 3.6 3.7  CL 101 101  CO2 21* 23  GLUCOSE 242* 263*  BUN 12 12  CREATININE 0.63 0.68  CALCIUM 8.1* 8.1*   Liver Function Tests No results for input(s): AST, ALT, ALKPHOS, BILITOT, PROT, ALBUMIN in the last 72 hours. No results for input(s): LIPASE, AMYLASE in the last 72 hours. Cardiac Enzymes No results for input(s): CKTOTAL, CKMB, CKMBINDEX, TROPONINI in the last 72 hours. BNP Invalid input(s): POCBNP D-Dimer No results for input(s): DDIMER in the last 72 hours. Hemoglobin A1C No results for input(s): HGBA1C in the last 72 hours. Fasting Lipid Panel No results for input(s): CHOL, HDL, LDLCALC, TRIG, CHOLHDL, LDLDIRECT in the last 72 hours. Thyroid Function Tests No results for input(s): TSH, T4TOTAL, T3FREE, THYROIDAB in the last 72 hours.  Invalid input(s): FREET3 _____________  Ct Angio Chest Pe W Or Wo Contrast  Result Date: 01/05/2017 CLINICAL DATA:  Recurrent DVT.  Shortness of breath. EXAM: CT ANGIOGRAPHY CHEST WITH CONTRAST TECHNIQUE: Multidetector CT imaging of the chest was performed using the standard protocol during bolus administration of intravenous  contrast. Multiplanar CT image reconstructions and MIPs were obtained to evaluate the vascular anatomy. CONTRAST:  100mL ISOVUE-370 IOPAMIDOL (ISOVUE-370) INJECTION 76% COMPARISON:  None. FINDINGS: Cardiovascular: Satisfactory opacification of the pulmonary arteries to the segmental level. No evidence of pulmonary embolism when allowing for areas of motion artifact. Mild cardiomegaly. Atherosclerotic calcification in the left and right coronary circulation. Limited opacification of the systemic arterial tree. No acute aortic finding. Mediastinum/Nodes: Negative for adenopathy or mass. Lungs/Pleura: There is no edema, consolidation, effusion, or pneumothorax. 5 mm average diameter left lower lobe pulmonary nodule. Borderline airway thickening. Upper Abdomen: Prominent hepatic steatosis. Musculoskeletal: No acute finding Review of the MIP images confirms the above findings. IMPRESSION: 1. Negative for pulmonary embolism or other acute finding. 2. Coronary atherosclerosis. 3. 5 mm left lower lobe pulmonary nodule. Non-contrast chest CT can be considered in 12 months in this smoker. This recommendation follows the consensus statement: Guidelines for Management of Incidental Pulmonary Nodules Detected on CT Images: From  the Fleischner Society 2017; Radiology 2017; [id number]. 4. Prominent hepatic steatosis. Electronically Signed   By: Marnee SpringJonathon  Watts M.D.   On: 01/05/2017 16:31   Disposition   Pt is being discharged home today in good condition.  Follow-up Plans & Appointments    Follow-up Information    [facility] Follow up on 01/20/2017.   Why:  9:30 for hospital follow up Contact information: [address] [id number] [place] [id number] [phone]       [facility] Follow up.   Why:   you can utilize pharmacy for medication assitance, since you have apt with [facility] Contact information: [address] [place] [phone]       Allayne ButcherSimmons, Brittainy M, PA-C Follow up on 01/14/2017.   Specialties:  Cardiology, Radiology Why:  at 9:30am for your follow up appt.  Contact information: [address] [place] [id number] [phone]          Discharge Instructions    Amb Referral to Cardiac Rehabilitation   Complete by:  As directed    Diagnosis:   STEMI Coronary Stents     Call MD for:  redness, tenderness, or signs of infection (pain, swelling, redness, odor or green/yellow discharge around incision site)   Complete by:  As directed    Diet - low sodium heart healthy   Complete by:  As directed    Discharge instructions   Complete by:  As directed    Radial Site Care Refer to this sheet in the next few weeks. These instructions provide you with information on caring for yourself after your procedure. Your caregiver may also give you more specific instructions. Your treatment has been planned according to current medical practices, but problems sometimes occur. Call your caregiver if you have any problems or questions after your procedure. HOME CARE INSTRUCTIONS You may shower the day after the procedure.Remove the bandage (dressing) and gently wash the site with plain soap and water.Gently pat the site dry.  Do not apply powder or lotion to the site.  Do not submerge the affected site in water for 3 to 5 days.  Inspect the site at least twice daily.  Do not flex or bend the affected arm for 24 hours.  No lifting over 5 pounds (2.3 kg) for 5 days after your procedure.  Do not drive home if you are discharged the same day of the procedure. Have someone else drive you.  You may drive 24 hours after the procedure unless otherwise instructed by your caregiver.  What to expect: Any bruising will usually fade within 1 to 2 weeks.  Blood that collects in the tissue (hematoma) may be painful to the touch. It should usually decrease in size  and tenderness within 1 to 2 weeks.  SEEK IMMEDIATE MEDICAL CARE IF: You have unusual pain at the radial site.  You have redness, warmth, swelling, or pain at the radial site.  You have drainage (other than a small amount of blood on the dressing).  You have chills.  You have a fever or persistent symptoms for more than 72 hours.  You have a fever and your symptoms suddenly get worse.  Your arm becomes pale, cool, tingly, or numb.  You have heavy bleeding from the site. Hold pressure on the site.   PLEASE DO NOT MISS ANY DOSES OF YOUR PLAVIX!!!!! Also keep a log of you blood  pressures and bring back to your follow up appt. Please call the office with any questions.   Patients taking blood thinners should generally stay away from medicines like ibuprofen, Advil, Motrin, naproxen, and Aleve due to risk of stomach bleeding. You may take Tylenol as directed or talk to your primary doctor about alternatives.  You will be on aspirin, plavix and Xarelto for one month. Please look for s/s of bleeding with these medications. Afterwards will plan for plavix and Xarelto.   Increase activity slowly   Complete by:  As directed       Discharge Medications     Medication List    TAKE these medications   amLODipine 5 MG tablet Commonly known as:  NORVASC Take 1 tablet (5 mg total) by mouth daily.   aspirin 81 MG chewable tablet Chew 1 tablet (81 mg total) by mouth daily. Start taking on:  01/08/2017   atorvastatin 80 MG tablet Commonly known as:  LIPITOR Take 1 tablet (80 mg total) by mouth daily.   clopidogrel 75 MG tablet Commonly known as:  PLAVIX Take 1 tablet (75 mg total) by mouth daily.   glipiZIDE 5 MG 24 hr tablet Commonly known as:  GLUCOTROL XL Take 5 mg by mouth daily with breakfast.   losartan 50 MG tablet Commonly known as:  COZAAR Take 50 mg by mouth daily.   metFORMIN 500 MG tablet Commonly known as:  GLUCOPHAGE Take 500 mg by mouth 2 (two) times daily with a  meal.   metoprolol tartrate 25 MG tablet Commonly known as:  LOPRESSOR Take 1 tablet (25 mg total) by mouth 2 (two) times daily.   nitroGLYCERIN 0.4 MG SL tablet Commonly known as:  NITROSTAT Place 1 tablet (0.4 mg total) under the tongue every 5 (five) minutes as needed.   oseltamivir 75 MG capsule Commonly known as:  TAMIFLU Take 1 capsule (75 mg total) by mouth 2 (two) times daily.   XARELTO 20 MG Tabs tablet Generic drug:  rivaroxaban Take 20 mg by mouth daily.        Aspirin prescribed at discharge?  Yes High Intensity Statin Prescribed? (Lipitor 40-80mg  or Crestor 20-40mg ): Yes Beta Blocker Prescribed? Yes For EF <40%, was ACEI/ARB Prescribed? Yes ADP Receptor Inhibitor Prescribed? (i.e. Plavix etc.-Includes Medically Managed Patients): Yes For EF <40%, Aldosterone Inhibitor Prescribed? No: EF ok Was EF assessed during THIS hospitalization? Yes Was Cardiac Rehab II ordered? (Included Medically managed Patients): Yes   Outstanding Labs/Studies   N/a   Duration of Discharge Encounter   Greater than 30 minutes including physician time.  Signed, Laverda Page NP-C 01/07/2017, 11:30 AM

## 2017-01-11 LAB — CULTURE, BLOOD (ROUTINE X 2)
CULTURE: NO GROWTH
Culture: NO GROWTH
SPECIAL REQUESTS: ADEQUATE

## 2017-01-11 NOTE — Telephone Encounter (Signed)
Patient [facility] on January 07, 2017.  Patient understands to follow up with provider Boyce MediciBrittany Simmons, PA-C on January 14, 2017 at 9:30A [facility]. Patient understands discharge instructions? Yes Patient understands medications and regiment? Yes Patient understands to bring all medications to this visit? Yes

## 2017-01-11 NOTE — Telephone Encounter (Signed)
Left message to call back  

## 2017-01-14 ENCOUNTER — Ambulatory Visit (INDEPENDENT_AMBULATORY_CARE_PROVIDER_SITE_OTHER): Payer: Self-pay | Admitting: Cardiology

## 2017-01-14 ENCOUNTER — Encounter: Payer: Self-pay | Admitting: Cardiology

## 2017-01-14 VITALS — BP 110/82 | HR 79 | Ht 68.0 in | Wt 207.4 lb

## 2017-01-14 DIAGNOSIS — I2109 ST elevation (STEMI) myocardial infarction involving other coronary artery of anterior wall: Secondary | ICD-10-CM

## 2017-01-14 DIAGNOSIS — I2510 Atherosclerotic heart disease of native coronary artery without angina pectoris: Secondary | ICD-10-CM

## 2017-01-14 MED ORDER — METOPROLOL TARTRATE 50 MG PO TABS: 50.0000 mg | ORAL_TABLET | Freq: Two times a day (BID) | ORAL | 3 refills | Status: DC

## 2017-01-14 NOTE — Patient Instructions (Addendum)
Medication Instructions:  Your physician has recommended you make the following change in your medication:  STOP Aspirin on January 24 INCREASE Metoprolol (Lopressor) to 50 mg twice daily   Labwork: Your physician recommends that you return for lab work in: 4 weeks for fasting cholesterol and liver panel Do not eat or drink anything but water after midnight until your blood work is collected   Testing/Procedures: None Ordered   Follow-Up: Your physician recommends that you schedule a follow-up appointment in: 3 months with Dr. Eldridge DaceVaranasi  You have been [facility] - they will call you to schedule   If you need a refill on your cardiac medications before your next appointment, please call your pharmacy.   Thank you for choosing [facility]! Eligha BridegroomMichelle Azari Hasler, RN [phone]

## 2017-01-14 NOTE — Progress Notes (Signed)
01/14/2017 HAYDON DORRIS   1965/09/23  [id number]  Primary Physician Wilson Singer, MD Primary Cardiologist: Dr. Eldridge Dace    Reason for Visit/CC: [facility] f/u for CAD s/p Inferior STEMI  HPI:  Jacob Cooper is a 52 y.o. male who is being seen today for post hospital f/u after recent treatment for ACS.   He has a PMH of tobacco use, HTN, DM and DVT on Xarelto therapy and presented to the [facility] ED on 01/03/17 with chest pain and EKG showing inferior ST elevation. CODE STEMI was activated and he was taken to cath lab. Procedure performed by Dr. Eldridge Dace. He was found to have a 99% occluded mid RCA. This is a very tortuous vessel. He underwent successful PCI + DES placement to the mid RCA. There was mild, nonobstructive disease, no more than 25% CAD, in the LAD, Lcx and Ramus intermedius. He had residual thrombus burden in the distal RCA and was placed on Aggrastat for 18 hours as well as heparin. EF by 2D echo mildly reduced at 40-45%. He was placed on DAPT with ASA and Plavix. Xarelto continued given h/o recurrent DVTs. Plan is for triple therapy x 30 days, followed by discontinuation of ASA. He was also placed on high dose statin therapy. Lipid panel showed elevated TG at 472. Unable to calculate LDL given TG > 400. He was also placed on a BB and ARB. Hgb A1c showed poorly controlled DM at 10.4. His oral DM meds were continued at time of discharge and smoking cessation was strongly advised.   He presents to clinic today for post hospital f/u. He is doing fairly well. He notes 2 episodes of recurrent CP since discharge. Both episodes occurred when he was "worked up" (stressed at home, 2 kids). Pain resolved after nitro x 1. He denies any exertional CP. No dyspnea. Right radial cath site is healed. He reports full med compliance. No fluid retention. He has quit smoking.   Current Meds  Medication Sig  . amLODipine (NORVASC) 5 MG tablet Take 1 tablet (5 mg total) by mouth daily.  Marland Kitchen aspirin 81  MG chewable tablet Chew 1 tablet (81 mg total) by mouth daily.  Marland Kitchen atorvastatin (LIPITOR) 80 MG tablet Take 1 tablet (80 mg total) by mouth daily.  . clopidogrel (PLAVIX) 75 MG tablet Take 1 tablet (75 mg total) by mouth daily.  Marland Kitchen glipiZIDE (GLUCOTROL XL) 5 MG 24 hr tablet Take 5 mg by mouth daily with breakfast.  . losartan (COZAAR) 50 MG tablet Take 50 mg by mouth daily.  . metFORMIN (GLUCOPHAGE) 500 MG tablet Take 500 mg by mouth 2 (two) times daily with a meal.  . nitroGLYCERIN (NITROSTAT) 0.4 MG SL tablet Place 1 tablet (0.4 mg total) under the tongue every 5 (five) minutes as needed.  . rivaroxaban (XARELTO) 20 MG TABS tablet Take 20 mg by mouth daily.  . [DISCONTINUED] metoprolol tartrate (LOPRESSOR) 25 MG tablet Take 1 tablet (25 mg total) by mouth 2 (two) times daily.   No Known Allergies Past Medical History:  Diagnosis Date  . Coronary artery disease   . Diabetes mellitus without complication (HCC)   . Hyperlipidemia   . Hypertension   . Myocardial infarction (HCC)    12/18 PCI/DES x1 RCA, EF 40-45%   History reviewed. No pertinent family history. Past Surgical History:  Procedure Laterality Date  . CARDIAC CATHETERIZATION    . CORONARY/GRAFT ACUTE MI REVASCULARIZATION N/A 01/03/2017   Procedure: Coronary/Graft Acute MI Revascularization;  Surgeon: Corky Crafts, MD;  Location: [facility] INVASIVE CV LAB;  Service: Cardiovascular;  Laterality: N/A;  . HERNIA REPAIR    . LEFT HEART CATH AND CORONARY ANGIOGRAPHY N/A 01/03/2017   Procedure: LEFT HEART CATH AND CORONARY ANGIOGRAPHY;  Surgeon: Corky Crafts, MD;  Location: [facility] INVASIVE CV LAB;  Service: Cardiovascular;  Laterality: N/A;  . TONSILLECTOMY     Social History   Socioeconomic History  . Marital status: Married    Spouse name: Not on file  . Number of children: Not on file  . Years of education: Not on file  . Highest education level: Not on file  Social Needs  . Financial resource strain: Not very hard    . Food insecurity - worry: Patient refused  . Food insecurity - inability: Patient refused  . Transportation needs - medical: Patient refused  . Transportation needs - non-medical: Patient refused  Occupational History  . Not on file  Tobacco Use  . Smoking status: Current Every Day Smoker    Years: 15.00    Types: Pipe  . Smokeless tobacco: Former Neurosurgeon    Types: Chew    Quit date: 1992  Substance and Sexual Activity  . Alcohol use: Yes    Alcohol/week: 1.8 - 2.4 oz    Types: 3 - 4 Shots of liquor per week  . Drug use: No  . Sexual activity: Yes    Partners: Female  Other Topics Concern  . Not on file  Social History Narrative  . Not on file     Review of Systems: General: negative for chills, fever, night sweats or weight changes.  Cardiovascular: negative for chest pain, dyspnea on exertion, edema, orthopnea, palpitations, paroxysmal nocturnal dyspnea or shortness of breath Dermatological: negative for rash Respiratory: negative for cough or wheezing Urologic: negative for hematuria Abdominal: negative for nausea, vomiting, diarrhea, bright red blood per rectum, melena, or hematemesis Neurologic: negative for visual changes, syncope, or dizziness All other systems reviewed and are otherwise negative except as noted above.   Physical Exam:  Blood pressure 110/82, pulse 79, height 5\' 8"  (1.727 m), weight 207 lb 6.4 oz (94.1 kg), SpO2 95 %.  General appearance: alert, cooperative and no distress Neck: no carotid bruit and no JVD Lungs: clear to auscultation bilaterally Heart: regular rate and rhythm, S1, S2 normal, no murmur, click, rub or gallop Extremities: extremities normal, atraumatic, no cyanosis or edema Pulses: 2+ and symmetric Skin: Skin color, texture, turgor normal. No rashes or lesions Neurologic: Grossly normal  EKG not performed -- personally reviewed   [facility]HC 01/03/17 Conclusion     Mid RCA lesion is 99% stenosed. Severely tortuous vessel.  A  drug-eluting stent was successfully placed using a STENT SYNERGY DES 3X16.  Post intervention, there is a 0% residual stenosis.  Mid RCA to Dist RCA lesion is 90% stenosed. Likely thrombus embolized from the initial lesion.  Balloon angioplasty was performed using a BALLOON EMERGE MR 3.0X12. Due to 360 degree loop, it was very unlikely a stent would traverse through there.  Post intervention, there is a 10% residual stenosis.  There is moderate left ventricular systolic dysfunction.  The left ventricular ejection fraction is 35-45% by visual estimate.  LV end diastolic pressure is mildly elevated.  There is no aortic valve stenosis.     2D Echo 01/04/17 Study Conclusions  - Left ventricle: The cavity size was mildly dilated. Systolic function was mildly to moderately reduced. The estimated ejection fraction was in the range  of 40% to 45%. There is severe hypokinesis of the inferior myocardium. Features are consistent with a pseudonormal left ventricular filling pattern, with concomitant abnormal relaxation and increased filling pressure (grade 2 diastolic dysfunction). - Left atrium: The atrium was mildly dilated. - Right ventricle: The cavity size was mildly dilated. Wall thickness was normal. - Pulmonary arteries: Systolic pressure could not be accurately estimated.    ASSESSMENT AND PLAN:   1. CAD: s/p recent inferior STEMI 2/2 occluded mid RCA s/p PCI + DES. There was mild, nonobstructive disease, no more than 25% CAD in the LAD, Lcx and Ramus intermedius. EF by 2D echo mildly reduced at 40-45%. He was placed on DAPT with ASA and Plavix. Xarelto continued given h/o recurrent DVTs. Plan is for tripple therapy x 30 days, followed by discontinuation of ASA. Also on high dose statin therapy, BB and ARB. We will further increase metoprolol to 50 mg BID.   2. Combined Systolic and Diastolic HF: echo showed mildly reduced EF at 40-45% with Grade 2DD. Continue  BB and ARB therapy. Volume is stable.   3. HLD: unable to calculate LDL on recent FLP given TG >400. High dose statin therapy initiated. Plan to check FLP and HFTs in 4 weeks. Goal LDL is <70 mg/dL. If not at goal, will add Zetia to regimen.   4. DM: poorly controlled. Recent Hgb A1c was 10. He is oral DM medications followed by PCP. He plans to f/u with PCP soon to adjust meds.   5. HTN: controlled on current regimen.   6. Tobacco Abuse: pt reports he quit smoking. He was congradulated on his efforts.   7. 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    Follow-Up w/ Dr. Eldridge DaceVaranasi in 3 months   Brittainy Delmer IslamSimmons PA-C, [facility] 01/14/2017 12:59 PM

## 2017-01-20 ENCOUNTER — Ambulatory Visit: Payer: Self-pay | Admitting: Family Medicine

## 2017-01-21 ENCOUNTER — Ambulatory Visit: Payer: 59 | Admitting: General Surgery

## 2017-01-29 ENCOUNTER — Telehealth: Payer: Self-pay | Admitting: Cardiology

## 2017-01-29 ENCOUNTER — Telehealth: Payer: Self-pay | Admitting: Interventional Cardiology

## 2017-01-29 NOTE — Telephone Encounter (Signed)
Left message for patient to call back  

## 2017-01-29 NOTE — Telephone Encounter (Signed)
See prior note from today.

## 2017-01-29 NOTE — Telephone Encounter (Signed)
New message   Patient returning call for Jacob Cooper

## 2017-01-29 NOTE — Telephone Encounter (Signed)
Returned pts call and he was actually needing Daleen BoBrittany Currie, Charity fundraiserN. I will let her know that he is returning her call.

## 2017-01-29 NOTE — Telephone Encounter (Signed)
Patient calling back. Made patient aware that the dates filled out on his paperwork were for 01/03/17 through 03/06/17. Made him aware that the paperwork has been returned to Medical Records. Patient verbalized understanding and thanked me for the call.

## 2017-01-29 NOTE — Telephone Encounter (Addendum)
Returned call to patient. Patient wanting to know the status of the paperwork that he dropped off. Asked the patient if he has returned to work yet and he states that he has not. Patient states that he is a [employer] and that he uses a lease company called [employer] and that they told him that he will need DOT Clearance and that he would have to complete 18 weeks of cardiac rehab before he can go back. Made patient aware that he should not have to complete 18 weeks of Cardiac Rehab prior to being cleared by DOT, that there is only a 2 month waiting period after MI. Patient states that he is going to call and verify this information and call me back.   Patient call back later and states that he will just need to wait 2 months post MI before being eligible for clearance by DOT to return to work. Patient asking if Dr. Eldridge DaceVaranasi could put the end date on the form for that time. Made patient aware that I will discuss his paperwork with Dr. Eldridge DaceVaranasi and let him know once completed.

## 2017-01-29 NOTE — Telephone Encounter (Signed)
New message  ° ° °Patient returning nurse call.   °

## 2017-02-03 ENCOUNTER — Telehealth: Payer: Self-pay

## 2017-02-03 NOTE — Telephone Encounter (Signed)
Pt called wanting to change Xarelto to something cheaper because of new insurance plan. He would like you to contact him at [phone] he also has additional questions concerning other medications.

## 2017-02-04 NOTE — Telephone Encounter (Signed)
Left message for patient to call back  

## 2017-02-09 ENCOUNTER — Telehealth: Payer: Self-pay | Admitting: Interventional Cardiology

## 2017-02-09 NOTE — Telephone Encounter (Signed)
Please leave a voice mail if you do not reach him per pt.

## 2017-02-09 NOTE — Telephone Encounter (Signed)
[facility] And would like to take something else.   Please give him a call back.

## 2017-02-10 NOTE — Telephone Encounter (Signed)
Left detailed message on patient's VM stating that the information would be sent to PA nurse for review and that she should be reaching out to him.

## 2017-02-10 NOTE — Telephone Encounter (Signed)
Left detailed message on patient's VM stating that the information would be sent to PA nurse for review and that she should be reaching out to him. (see phone note from 1/29)

## 2017-02-11 ENCOUNTER — Other Ambulatory Visit: Payer: Self-pay

## 2017-02-11 NOTE — Telephone Encounter (Signed)
Spoke with patient on phone. I am mailing him patient assistance forms from [facility] [employer] for help with XARELTO. Dr Eldridge DaceVaranasi is in office today, will get him to sign provider part of application. He is in the process of trying to get medicaid and or Affordable Care Act. He also is interested in transferring to [place] office since he actually [place] close [place] [employer] [place], [place].

## 2017-02-11 NOTE — Telephone Encounter (Signed)
Will place signed provider portion of [employer] patient assistance form in yellow folder and await patients information.

## 2017-02-16 ENCOUNTER — Other Ambulatory Visit: Payer: Self-pay

## 2017-02-19 ENCOUNTER — Encounter: Payer: Self-pay | Admitting: Family Medicine

## 2017-02-19 ENCOUNTER — Other Ambulatory Visit: Payer: Self-pay

## 2017-02-19 ENCOUNTER — Ambulatory Visit: Payer: Self-pay | Attending: Family Medicine | Admitting: Family Medicine

## 2017-02-19 VITALS — BP 123/82 | HR 79 | Temp 97.6°F | Ht 68.0 in | Wt 213.2 lb

## 2017-02-19 DIAGNOSIS — Z7982 Long term (current) use of aspirin: Secondary | ICD-10-CM | POA: Insufficient documentation

## 2017-02-19 DIAGNOSIS — E119 Type 2 diabetes mellitus without complications: Secondary | ICD-10-CM | POA: Insufficient documentation

## 2017-02-19 DIAGNOSIS — Z955 Presence of coronary angioplasty implant and graft: Secondary | ICD-10-CM | POA: Insufficient documentation

## 2017-02-19 DIAGNOSIS — Z9119 Patient's noncompliance with other medical treatment and regimen: Secondary | ICD-10-CM | POA: Insufficient documentation

## 2017-02-19 DIAGNOSIS — Z7902 Long term (current) use of antithrombotics/antiplatelets: Secondary | ICD-10-CM | POA: Insufficient documentation

## 2017-02-19 DIAGNOSIS — Z79899 Other long term (current) drug therapy: Secondary | ICD-10-CM | POA: Insufficient documentation

## 2017-02-19 DIAGNOSIS — I2510 Atherosclerotic heart disease of native coronary artery without angina pectoris: Secondary | ICD-10-CM | POA: Insufficient documentation

## 2017-02-19 DIAGNOSIS — I252 Old myocardial infarction: Secondary | ICD-10-CM | POA: Insufficient documentation

## 2017-02-19 DIAGNOSIS — Z7901 Long term (current) use of anticoagulants: Secondary | ICD-10-CM | POA: Insufficient documentation

## 2017-02-19 DIAGNOSIS — Z7984 Long term (current) use of oral hypoglycemic drugs: Secondary | ICD-10-CM | POA: Insufficient documentation

## 2017-02-19 DIAGNOSIS — I10 Essential (primary) hypertension: Secondary | ICD-10-CM | POA: Insufficient documentation

## 2017-02-19 DIAGNOSIS — Z9889 Other specified postprocedural states: Secondary | ICD-10-CM | POA: Insufficient documentation

## 2017-02-19 DIAGNOSIS — Z86718 Personal history of other venous thrombosis and embolism: Secondary | ICD-10-CM | POA: Insufficient documentation

## 2017-02-19 DIAGNOSIS — E785 Hyperlipidemia, unspecified: Secondary | ICD-10-CM | POA: Insufficient documentation

## 2017-02-19 DIAGNOSIS — E1165 Type 2 diabetes mellitus with hyperglycemia: Secondary | ICD-10-CM | POA: Insufficient documentation

## 2017-02-19 MED ORDER — ATORVASTATIN CALCIUM 80 MG PO TABS: 80.0000 mg | ORAL_TABLET | Freq: Every day | ORAL | 3 refills | Status: DC

## 2017-02-19 MED ORDER — CLOPIDOGREL BISULFATE 75 MG PO TABS: 75.0000 mg | ORAL_TABLET | Freq: Every day | ORAL | 3 refills | Status: DC

## 2017-02-19 MED ORDER — AMLODIPINE BESYLATE 5 MG PO TABS: 5.0000 mg | ORAL_TABLET | Freq: Every day | ORAL | 3 refills | Status: DC

## 2017-02-19 MED ORDER — RIVAROXABAN 20 MG PO TABS: 20.0000 mg | ORAL_TABLET | Freq: Every day | ORAL | 0 refills | Status: DC

## 2017-02-19 MED ORDER — METFORMIN HCL 500 MG PO TABS: 1000.0000 mg | ORAL_TABLET | Freq: Two times a day (BID) | ORAL | 3 refills | Status: DC

## 2017-02-19 MED ORDER — GLIPIZIDE ER 10 MG PO TB24: 10.0000 mg | ORAL_TABLET | Freq: Every day | ORAL | 3 refills | Status: DC

## 2017-02-19 MED FILL — ?CLOPIDOGREL 75MG TA: 75 | 30 days supply | Qty: 30 | Fill #0

## 2017-02-19 MED FILL — ATORVASTATIN 80 MG TABLET: 80 | 30 days supply | Qty: 30 | Fill #0

## 2017-02-19 MED FILL — glipiZIDE ER 10 MG TB24: 10 | 30 days supply | Qty: 30 | Fill #0

## 2017-02-19 MED FILL — ?METFORMIN HCL 500MG TABLET: 500 | 30 days supply | Qty: 120 | Fill #0

## 2017-02-19 MED FILL — XARELTO 20 MG TABLET: 20 | 30 days supply | Qty: 30 | Fill #0

## 2017-02-19 MED FILL — ?AMLODIPINE BESYLATE 5 MG T: 5 MG | 30 days supply | Qty: 30 | Fill #0

## 2017-02-19 NOTE — Progress Notes (Signed)
Subjective:  Patient ID: Jacob Cooper, male    DOB: 1965/07/31  Age: 52 y.o. MRN: [id number]  CC: Establish Care   HPI Prospero GORAN OLDEN is a 52 year old male with a history of Type Diabetes Mellitus (A1c 10.4), Hypertension, previous DVT recently hospitalized at [facility] from 01/03/17 - 01/07/17 for STEMI.  He had [facility] the ED [facility] with chest pain, troponins were 13.00< 65.00>51.96, EKG Revealed STEMI. Morphine administered and he was transferred to [facility] where he was taken to the cardiac cath lab.  LHC: Conclusion     Mid RCA lesion is 99% stenosed. Severely tortuous vessel.  A drug-eluting stent was successfully placed using a STENT SYNERGY DES 3X16.  Post intervention, there is a 0% residual stenosis.  Mid RCA to Dist RCA lesion is 90% stenosed. Likely thrombus embolized from the initial lesion.  Balloon angioplasty was performed using a BALLOON EMERGE MR 3.0X12. Due to 360 degree loop, it was very unlikely a stent would traverse through there.  Post intervention, there is a 10% residual stenosis.  There is moderate left ventricular systolic dysfunction.  The left ventricular ejection fraction is 35-45% by visual estimate.  LV end diastolic pressure is mildly elevated.  There is no aortic valve stenosis.   He received IV Tirofiban for 18 hours after which he was transitioned to ASA and Plavix.High dose statin and ARB commenced. Echo revealed EF 40-45%.  Hospital course was complicated by fever and a positive test for influenza for which Tamiflu was commenced. His condition improved after which he was discharged on ASA, PLavix and Xarelto with ASA to be discontinued after 30 days.  He presents today doing well and denies chest pain or dyspnea. He had a follow up with Cardiology last month and the dose of Metoprolol was increased. Today he informs me he ran out of Xarelto 2 weeks ago. He has been compliant with his Diabetic meds  but sugars have been uncontrolled ever since his PCP retired. CBG is 22 today.  Past Medical History:  Diagnosis Date  . Coronary artery disease   . Diabetes mellitus without complication (HCC)   . Hyperlipidemia   . Hypertension   . Myocardial infarction (HCC)    12/18 PCI/DES x1 RCA, EF 40-45%    Past Surgical History:  Procedure Laterality Date  . CARDIAC CATHETERIZATION    . CORONARY/GRAFT ACUTE MI REVASCULARIZATION N/A 01/03/2017   Procedure: Coronary/Graft Acute MI Revascularization;  Surgeon: Corky Crafts, MD;  Location: [facility] INVASIVE CV LAB;  Service: Cardiovascular;  Laterality: N/A;  . HERNIA REPAIR    . LEFT HEART CATH AND CORONARY ANGIOGRAPHY N/A 01/03/2017   Procedure: LEFT HEART CATH AND CORONARY ANGIOGRAPHY;  Surgeon: Corky Crafts, MD;  Location: [facility] INVASIVE CV LAB;  Service: Cardiovascular;  Laterality: N/A;  . TONSILLECTOMY      No Known Allergies   Outpatient Medications Prior to Visit  Medication Sig Dispense Refill  . aspirin 81 MG chewable tablet Chew 1 tablet (81 mg total) by mouth daily.    Marland Kitchen losartan (COZAAR) 50 MG tablet Take 50 mg by mouth daily.    . metoprolol tartrate (LOPRESSOR) 50 MG tablet Take 1 tablet (50 mg total) by mouth 2 (two) times daily. 180 tablet 3  . nitroGLYCERIN (NITROSTAT) 0.4 MG SL tablet Place 1 tablet (0.4 mg total) under the tongue every 5 (five) minutes as needed. 25 tablet 3  . amLODipine (NORVASC) 5 MG tablet Take 1  tablet (5 mg total) by mouth daily. 30 tablet 0  . atorvastatin (LIPITOR) 80 MG tablet Take 1 tablet (80 mg total) by mouth daily. 30 tablet 0  . clopidogrel (PLAVIX) 75 MG tablet Take 1 tablet (75 mg total) by mouth daily. 30 tablet 0  . glipiZIDE (GLUCOTROL XL) 5 MG 24 hr tablet Take 5 mg by mouth daily with breakfast.    . metFORMIN (GLUCOPHAGE) 500 MG tablet Take 500 mg by mouth 2 (two) times daily with a meal.    . rivaroxaban (XARELTO) 20 MG TABS tablet Take 20 mg by mouth daily.     No  facility-administered medications prior to visit.     ROS Review of Systems  Constitutional: Negative for activity change and appetite change.  HENT: Negative for sinus pressure and sore throat.   Eyes: Negative for visual disturbance.  Respiratory: Negative for cough, chest tightness and shortness of breath.   Cardiovascular: Negative for chest pain and leg swelling.  Gastrointestinal: Negative for abdominal distention, abdominal pain, constipation and diarrhea.  Endocrine: Negative.   Genitourinary: Negative for dysuria.  Musculoskeletal: Negative for joint swelling and myalgias.  Skin: Negative for rash.  Allergic/Immunologic: Negative.   Neurological: Negative for weakness, light-headedness and numbness.  Psychiatric/Behavioral: Negative for dysphoric mood and suicidal ideas.    Objective:  BP 123/82   Pulse 79   Temp 97.6 F (36.4 C) (Oral)   Ht 5\' 8"  (1.727 m)   Wt 213 lb 3.2 oz (96.7 kg)   SpO2 96%   BMI 32.42 kg/m   BP/Weight 02/19/2017 01/14/2017 01/07/2017  Systolic BP 123 110 125  Diastolic BP 82 82 93  Wt. (Lbs) 213.2 207.4 -  BMI 32.42 31.54 -      Physical Exam  Constitutional: He is oriented to person, place, and time. He appears well-developed and well-nourished.  Cardiovascular: Normal rate, normal heart sounds and intact distal pulses.  No murmur heard. Pulmonary/Chest: Effort normal and breath sounds normal. He has no wheezes. He has no rales. He exhibits no tenderness.  Abdominal: Soft. Bowel sounds are normal. He exhibits no distension and no mass. There is no tenderness.  Musculoskeletal: Normal range of motion.  Neurological: He is alert and oriented to person, place, and time.  Skin: Skin is warm and dry.  Psychiatric: He has a normal mood and affect.    Lab Results  Component Value Date   HGBA1C 10.4 (H) 01/03/2017    Assessment & Plan:   1. Type 2 diabetes mellitus with hyperglycemia, without long-term current use of insulin  (HCC) Uncontrolled with A1c of 10.4 Increased dose of Metformin and Glipizide and will review blood sugar log at next visit Counseled on Diabetic diet, my plate method, 161 minutes of moderate intensity exercise/week Keep blood sugar logs with fasting goals of 80-120 mg/dl, random of less than 096 and in the event of sugars less than 60 mg/dl or greater than 045 mg/dl please notify the clinic ASAP. It is recommended that you undergo annual eye exams and annual foot exams. Pneumonia vaccine is recommended. - metFORMIN (GLUCOPHAGE) 500 MG tablet; Take 2 tablets (1,000 mg total) by mouth 2 (two) times daily with a meal.  Dispense: 120 tablet; Refill: 3 - glipiZIDE (GLUCOTROL XL) 10 MG 24 hr tablet; Take 1 tablet (10 mg total) by mouth daily with breakfast.  Dispense: 30 tablet; Refill: 3  2. Status post coronary artery stent placement Has been non compliant with Xarelto as he ran out We will provide  him with this today - clopidogrel (PLAVIX) 75 MG tablet; Take 1 tablet (75 mg total) by mouth daily.  Dispense: 30 tablet; Refill: 3  3. History of DVT (deep vein thrombosis) Will remain on lifelong anticoagulation - rivaroxaban (XARELTO) 20 MG TABS tablet; Take 1 tablet (20 mg total) by mouth daily.  Dispense: 30 tablet; Refill: 0  4. Essential hypertension Controlled Counseled on blood pressure goal of less than 130/80, low-sodium, DASH diet, medication compliance, 150 minutes of moderate intensity exercise per week. Discussed medication compliance, adverse effects. - amLODipine (NORVASC) 5 MG tablet; Take 1 tablet (5 mg total) by mouth daily.  Dispense: 30 tablet; Refill: 3  5. Coronary artery disease involving native coronary artery of native heart without angina pectoris Risk factor modification including smoking cessation - atorvastatin (LIPITOR) 80 MG tablet; Take 1 tablet (80 mg total) by mouth daily.  Dispense: 30 tablet; Refill: 3   Meds ordered this encounter  Medications  .  metFORMIN (GLUCOPHAGE) 500 MG tablet    Sig: Take 2 tablets (1,000 mg total) by mouth 2 (two) times daily with a meal.    Dispense:  120 tablet    Refill:  3  . glipiZIDE (GLUCOTROL XL) 10 MG 24 hr tablet    Sig: Take 1 tablet (10 mg total) by mouth daily with breakfast.    Dispense:  30 tablet    Refill:  3  . rivaroxaban (XARELTO) 20 MG TABS tablet    Sig: Take 1 tablet (20 mg total) by mouth daily.    Dispense:  30 tablet    Refill:  0  . amLODipine (NORVASC) 5 MG tablet    Sig: Take 1 tablet (5 mg total) by mouth daily.    Dispense:  30 tablet    Refill:  3  . atorvastatin (LIPITOR) 80 MG tablet    Sig: Take 1 tablet (80 mg total) by mouth daily.    Dispense:  30 tablet    Refill:  3  . clopidogrel (PLAVIX) 75 MG tablet    Sig: Take 1 tablet (75 mg total) by mouth daily.    Dispense:  30 tablet    Refill:  3    Follow-up: Return in about 1 month (around 03/19/2017) for follow up on diabetes mellitus.   Hoy RegisterEnobong Bralin Garry MD

## 2017-02-19 NOTE — Patient Instructions (Signed)
Diabetes Mellitus and Nutrition When you have diabetes (diabetes mellitus), it is very important to have healthy eating habits because your blood sugar (glucose) levels are greatly affected by what you eat and drink. Eating healthy foods in the appropriate amounts, at about the same times every day, can help you:  Control your blood glucose.  Lower your risk of heart disease.  Improve your blood pressure.  Reach or maintain a healthy weight.  Every person with diabetes is different, and each person has different needs for a meal plan. Your health care provider may recommend that you work with a diet and nutrition specialist (dietitian) to make a meal plan that is best for you. Your meal plan may vary depending on factors such as:  The calories you need.  The medicines you take.  Your weight.  Your blood glucose, blood pressure, and cholesterol levels.  Your activity level.  Other health conditions you have, such as heart or kidney disease.  How do carbohydrates affect me? Carbohydrates affect your blood glucose level more than any other type of food. Eating carbohydrates naturally increases the amount of glucose in your blood. Carbohydrate counting is a method for keeping track of how many carbohydrates you eat. Counting carbohydrates is important to keep your blood glucose at a healthy level, especially if you use insulin or take certain oral diabetes medicines. It is important to know how many carbohydrates you can safely have in each meal. This is different for every person. Your dietitian can help you calculate how many carbohydrates you should have at each meal and for snack. Foods that contain carbohydrates include:  Bread, cereal, rice, pasta, and crackers.  Potatoes and corn.  Peas, beans, and lentils.  Milk and yogurt.  Fruit and juice.  Desserts, such as cakes, cookies, ice cream, and candy.  How does alcohol affect me? Alcohol can cause a sudden decrease in blood  glucose (hypoglycemia), especially if you use insulin or take certain oral diabetes medicines. Hypoglycemia can be a life-threatening condition. Symptoms of hypoglycemia (sleepiness, dizziness, and confusion) are similar to symptoms of having too much alcohol. If your health care provider says that alcohol is safe for you, follow these guidelines:  Limit alcohol intake to no more than 1 drink per day for nonpregnant women and 2 drinks per day for men. One drink equals 12 oz of beer, 5 oz of wine, or 1 oz of hard liquor.  Do not drink on an empty stomach.  Keep yourself hydrated with water, diet soda, or unsweetened iced tea.  Keep in mind that regular soda, juice, and other mixers may contain a lot of sugar and must be counted as carbohydrates.  What are tips for following this plan? Reading food labels  Start by checking the serving size on the label. The amount of calories, carbohydrates, fats, and other nutrients listed on the label are based on one serving of the food. Many foods contain more than one serving per package.  Check the total grams (g) of carbohydrates in one serving. You can calculate the number of servings of carbohydrates in one serving by dividing the total carbohydrates by 15. For example, if a food has 30 g of total carbohydrates, it would be equal to 2 servings of carbohydrates.  Check the number of grams (g) of saturated and trans fats in one serving. Choose foods that have low or no amount of these fats.  Check the number of milligrams (mg) of sodium in one serving. Most people   should limit total sodium intake to less than 2,300 mg per day.  Always check the nutrition information of foods labeled as "low-fat" or "nonfat". These foods may be higher in added sugar or refined carbohydrates and should be avoided.  Talk to your dietitian to identify your daily goals for nutrients listed on the label. Shopping  Avoid buying canned, premade, or processed foods. These  foods tend to be high in fat, sodium, and added sugar.  Shop around the outside edge of the grocery store. This includes fresh fruits and vegetables, bulk grains, fresh meats, and fresh dairy. Cooking  Use low-heat cooking methods, such as baking, instead of high-heat cooking methods like deep frying.  Cook using healthy oils, such as olive, canola, or sunflower oil.  Avoid cooking with butter, cream, or high-fat meats. Meal planning  Eat meals and snacks regularly, preferably at the same times every day. Avoid going long periods of time without eating.  Eat foods high in fiber, such as fresh fruits, vegetables, beans, and whole grains. Talk to your dietitian about how many servings of carbohydrates you can eat at each meal.  Eat 4-6 ounces of lean protein each day, such as lean meat, chicken, fish, eggs, or tofu. 1 ounce is equal to 1 ounce of meat, chicken, or fish, 1 egg, or 1/4 cup of tofu.  Eat some foods each day that contain healthy fats, such as avocado, nuts, seeds, and fish. Lifestyle   Check your blood glucose regularly.  Exercise at least 30 minutes 5 or more days each week, or as told by your health care provider.  Take medicines as told by your health care provider.  Do not use any products that contain nicotine or tobacco, such as cigarettes and e-cigarettes. If you need help quitting, ask your health care provider.  Work with a counselor or diabetes educator to identify strategies to manage stress and any emotional and social challenges. What are some questions to ask my health care provider?  Do I need to meet with a diabetes educator?  Do I need to meet with a dietitian?  What number can I call if I have questions?  When are the best times to check my blood glucose? Where to find more information:  American Diabetes Association: diabetes.org/food-and-fitness/food  Academy of Nutrition and Dietetics:  www.eatright.org/resources/health/diseases-and-conditions/diabetes  National Institute of Diabetes and Digestive and Kidney Diseases (NIH): www.niddk.nih.gov/health-information/diabetes/overview/diet-eating-physical-activity Summary  A healthy meal plan will help you control your blood glucose and maintain a healthy lifestyle.  Working with a diet and nutrition specialist (dietitian) can help you make a meal plan that is best for you.  Keep in mind that carbohydrates and alcohol have immediate effects on your blood glucose levels. It is important to count carbohydrates and to use alcohol carefully. This information is not intended to replace advice given to you by your health care provider. Make sure you discuss any questions you have with your health care provider. Document Released: 09/25/2004 Document Revised: 02/03/2016 Document Reviewed: 02/03/2016 Elsevier Interactive Patient Education  2018 [employer]  

## 2017-02-25 ENCOUNTER — Ambulatory Visit: Payer: Self-pay | Admitting: General Surgery

## 2017-03-01 ENCOUNTER — Ambulatory Visit: Payer: Self-pay | Attending: Family Medicine

## 2017-03-18 ENCOUNTER — Other Ambulatory Visit: Payer: Self-pay

## 2017-03-18 ENCOUNTER — Encounter: Payer: Self-pay | Admitting: Family Medicine

## 2017-03-18 ENCOUNTER — Ambulatory Visit: Payer: Self-pay | Attending: Family Medicine | Admitting: Family Medicine

## 2017-03-18 VITALS — BP 131/81 | HR 75 | Temp 98.4°F | Ht 68.0 in | Wt 214.0 lb

## 2017-03-18 DIAGNOSIS — I499 Cardiac arrhythmia, unspecified: Secondary | ICD-10-CM

## 2017-03-18 DIAGNOSIS — I10 Essential (primary) hypertension: Secondary | ICD-10-CM

## 2017-03-18 DIAGNOSIS — R05 Cough: Secondary | ICD-10-CM | POA: Insufficient documentation

## 2017-03-18 DIAGNOSIS — I252 Old myocardial infarction: Secondary | ICD-10-CM | POA: Insufficient documentation

## 2017-03-18 DIAGNOSIS — Z79899 Other long term (current) drug therapy: Secondary | ICD-10-CM | POA: Insufficient documentation

## 2017-03-18 DIAGNOSIS — Z7901 Long term (current) use of anticoagulants: Secondary | ICD-10-CM | POA: Insufficient documentation

## 2017-03-18 DIAGNOSIS — E1165 Type 2 diabetes mellitus with hyperglycemia: Secondary | ICD-10-CM

## 2017-03-18 DIAGNOSIS — I2510 Atherosclerotic heart disease of native coronary artery without angina pectoris: Secondary | ICD-10-CM

## 2017-03-18 DIAGNOSIS — Z7902 Long term (current) use of antithrombotics/antiplatelets: Secondary | ICD-10-CM | POA: Insufficient documentation

## 2017-03-18 DIAGNOSIS — F419 Anxiety disorder, unspecified: Secondary | ICD-10-CM | POA: Insufficient documentation

## 2017-03-18 DIAGNOSIS — B001 Herpesviral vesicular dermatitis: Secondary | ICD-10-CM

## 2017-03-18 DIAGNOSIS — E785 Hyperlipidemia, unspecified: Secondary | ICD-10-CM | POA: Insufficient documentation

## 2017-03-18 DIAGNOSIS — Z7984 Long term (current) use of oral hypoglycemic drugs: Secondary | ICD-10-CM | POA: Insufficient documentation

## 2017-03-18 DIAGNOSIS — Z9889 Other specified postprocedural states: Secondary | ICD-10-CM | POA: Insufficient documentation

## 2017-03-18 DIAGNOSIS — Z7982 Long term (current) use of aspirin: Secondary | ICD-10-CM | POA: Insufficient documentation

## 2017-03-18 LAB — POCT GLYCOSYLATED HEMOGLOBIN (HGB A1C): Hemoglobin A1C: 8.5

## 2017-03-18 LAB — GLUCOSE, POCT (MANUAL RESULT ENTRY): POC Glucose: 233 mg/dl — AB (ref 70–99)

## 2017-03-18 MED ORDER — ACYCLOVIR 5 % EX OINT: 1.0000 "application " | TOPICAL_OINTMENT | Freq: Four times a day (QID) | CUTANEOUS | 0 refills | Status: DC | PRN

## 2017-03-18 MED ORDER — LOSARTAN POTASSIUM 50 MG PO TABS: 50.0000 mg | ORAL_TABLET | Freq: Every day | ORAL | 3 refills | Status: DC

## 2017-03-18 MED FILL — ?ACYCLOVIR 5% OINTMENT: 5 | 10 days supply | Qty: 30 | Fill #0

## 2017-03-18 MED FILL — LOSARTAN POTASSIUM 50 MG TA: 50 | 30 days supply | Qty: 30 | Fill #0

## 2017-03-18 NOTE — Progress Notes (Signed)
Subjective:  Patient ID: Jacob Cooper, male    DOB: 10-01-1965  Age: 52 y.o. MRN: [id number]  CC: Annual Exam   HPI Jacob Cooper  is a 52 year old male with a history of Type Diabetes Mellitus (A1c 8.5), Hypertension, recurrent DVT recently hospitalized [facility] from 01/03/17 - 01/07/17 for STEMI, cardiac cath revealed 99% occluded mid RCA, he underwent DES to mid RCA. Echocardiogram revealed EF of 40-45%.   He has been compliant with aspirin, Plavix and Xarelto and was supposed to stop aspirin  however he informs me he continue taking it due to feeling irregular heartbeats and concerned that he could have another heart attack.  He describes this as "butterflies" he states whenever he coughs he has anxiety that he would have a heart attack and taking aspirin helps with the irregular heartbeats. Denies any bruising or bleeding and has no GI bleed.  His A1c is 8.5 which has improved from 10.4 previously and he informs me his fasting sugars have been in the 180-200 range.  He takes metformin 500 mg twice daily rather than 1000 mg twice daily, the dose of which had been increased at his last office visit. He denies visual concerns or numbness in extremities. He is concerned about cold sores in his upper lip but denies the presence of fever. Doing well on all his other chronic medications.  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  Past Medical History:  Diagnosis Date  . Coronary artery disease   . Diabetes mellitus without complication (HCC)   . Hyperlipidemia   . Hypertension   . Myocardial infarction (HCC)    12/18 PCI/DES x1 RCA, EF 40-45%    No Known Allergies   Outpatient Medications Prior to Visit  Medication Sig Dispense Refill  . amLODipine (NORVASC) 5 MG tablet Take 1 tablet (5 mg total) by mouth daily. 30 tablet 3  . aspirin 81 MG chewable tablet Chew 1 tablet (81 mg  total) by mouth daily.    Marland Kitchen. atorvastatin (LIPITOR) 80 MG tablet Take 1 tablet (80 mg total) by mouth daily. 30 tablet 3  . clopidogrel (PLAVIX) 75 MG tablet Take 1 tablet (75 mg total) by mouth daily. 30 tablet 3  . glipiZIDE (GLUCOTROL XL) 10 MG 24 hr tablet Take 1 tablet (10 mg total) by mouth daily with breakfast. 30 tablet 3  . metFORMIN (GLUCOPHAGE) 500 MG tablet Take 2 tablets (1,000 mg total) by mouth 2 (two) times daily with a meal. 120 tablet 3  . metoprolol tartrate (LOPRESSOR) 50 MG tablet Take 1 tablet (50 mg total) by mouth 2 (two) times daily. 180 tablet 3  . nitroGLYCERIN (NITROSTAT) 0.4 MG SL tablet Place 1 tablet (0.4 mg total) under the tongue every 5 (five) minutes as needed. 25 tablet 3  . rivaroxaban (XARELTO) 20 MG TABS tablet Take 1 tablet (20 mg total) by mouth daily. 30 tablet 0  . losartan (COZAAR) 50 MG tablet Take 50 mg by mouth daily.     No facility-administered medications prior to visit.     ROS Review of Systems  Constitutional: Negative for activity change and appetite change.  HENT: Negative for sinus pressure and sore throat.   Eyes: Negative for visual disturbance.  Respiratory: Negative for cough, chest tightness and shortness of breath.   Cardiovascular: Positive for palpitations. Negative for chest pain and  leg swelling.  Gastrointestinal: Negative for abdominal distention, abdominal pain, constipation and diarrhea.  Endocrine: Negative.   Genitourinary: Negative for dysuria.  Musculoskeletal: Negative for joint swelling and myalgias.  Skin: Positive for rash.  Allergic/Immunologic: Negative.   Neurological: Negative for weakness, light-headedness and numbness.  Psychiatric/Behavioral: Negative for dysphoric mood and suicidal ideas.    Objective:  BP 131/81   Pulse 75   Temp 98.4 F (36.9 C) (Oral)   Ht 5\' 8"  (1.727 m)   Wt 214 lb (97.1 kg)   SpO2 96%   BMI 32.54 kg/m   BP/Weight 03/18/2017 02/19/2017 01/14/2017  Systolic BP 131 123 110    Diastolic BP 81 82 82  Wt. (Lbs) 214 213.2 207.4  BMI 32.54 32.42 31.54      Physical Exam  Constitutional: He is oriented to person, place, and time. He appears well-developed and well-nourished.  Cardiovascular: Normal rate, normal heart sounds and intact distal pulses.  No murmur heard. Pulmonary/Chest: Effort normal and breath sounds normal. He has no wheezes. He has no rales. He exhibits no tenderness.  Abdominal: Soft. Bowel sounds are normal. He exhibits no distension and no mass. There is no tenderness.  Musculoskeletal: Normal range of motion.  Neurological: He is alert and oriented to person, place, and time.  Skin: Skin is warm and dry. Rash (cold sore on upper lip) noted.  Psychiatric: He has a normal mood and affect.     CMP Latest Ref Rng & Units 01/07/2017 01/06/2017 01/05/2017  Glucose 65 - 99 mg/dL 161(W) 960(A) 540(J)  BUN 6 - 20 mg/dL 12 12 12   Creatinine 0.61 - 1.24 mg/dL 8.11 9.14 7.82  Sodium 135 - 145 mmol/L 134(L) 133(L) 131(L)  Potassium 3.5 - 5.1 mmol/L 3.7 3.6 3.8  Chloride 101 - 111 mmol/L 101 101 99(L)  CO2 22 - 32 mmol/L 23 21(L) 20(L)  Calcium 8.9 - 10.3 mg/dL 8.1(L) 8.1(L) 8.1(L)  Total Protein 6.5 - 8.1 g/dL - - -  Total Bilirubin 0.3 - 1.2 mg/dL - - -  Alkaline Phos 38 - 126 U/L - - -  AST 15 - 41 U/L - - -  ALT 17 - 63 U/L - - -    Lipid Panel     Component Value Date/Time   CHOL 134 01/03/2017 1426   TRIG 472 (H) 01/03/2017 1426   HDL 22 (L) 01/03/2017 1426   CHOLHDL 6.1 01/03/2017 1426   VLDL UNABLE TO CALCULATE IF TRIGLYCERIDE OVER 400 mg/dL 95/62/1308 6578   LDLCALC UNABLE TO CALCULATE IF TRIGLYCERIDE OVER 400 mg/dL 46/96/2952 8413     Lab Results  Component Value Date   HGBA1C 8.5 03/18/2017     Assessment & Plan:   1. Type 2 diabetes mellitus with hyperglycemia, without long-term current use of insulin (HCC) Uncontrolled with A1c of 8.5 which has improved from 10.4 previously He has been taking 500 mg of metformin  twice daily rather than 1000 mg twice daily has been advised to adjust his dose; continue glipizide Continue current medications and diabetic diet as well as lifestyle modifications - POCT glucose (manual entry) - POCT glycosylated hemoglobin (Hb A1C)  2. Coronary artery disease involving native coronary artery of native heart without angina pectoris Status post DES to RCA Continue Xarelto, Plavix   3. Essential hypertension Controlled Counseled on blood pressure goal of less than 130/80, low-sodium, DASH diet, medication compliance, 150 minutes of moderate intensity exercise per week. Discussed medication compliance, adverse effects. - losartan (COZAAR) 50 MG tablet;  Take 1 tablet (50 mg total) by mouth daily.  Dispense: 30 tablet; Refill: 3  4. Herpes labialis - acyclovir ointment (ZOVIRAX) 5 %; Apply 1 application topically every 6 (six) hours as needed.  Dispense: 30 g; Refill: 0  5. Irregular heart beat EKG is unchanged from previous-reveals normal sinus rhythm, inferior infarct which is unchanged from previous EKG We will send of TSH to exclude a thyroid disorder He may need to wear a monitor and has been advised to go over to the cardiology clinic due to symptoms. - TSH   Meds ordered this encounter  Medications  . losartan (COZAAR) 50 MG tablet    Sig: Take 1 tablet (50 mg total) by mouth daily.    Dispense:  30 tablet    Refill:  3  . acyclovir ointment (ZOVIRAX) 5 %    Sig: Apply 1 application topically every 6 (six) hours as needed.    Dispense:  30 g    Refill:  0    Follow-up: Return in about 3 months (around 06/18/2017) for Follow-up of chronic medical conditions.   Hoy Register MD

## 2017-03-18 NOTE — Patient Instructions (Signed)
Cold Sore A cold sore, also called a fever blister, is a skin infection that is caused by a virus. This infection causes small, fluid-filled sores to form inside of the mouth or on the lips, gums, nose, chin, or cheeks. Cold sores can spread to other parts of the body, such as the eyes or fingers. Cold sores can be spread or passed from person to person (contagious) until the sores crust over completely. Cold sores can be spread through close contact, such as kissing or sharing a drinking glass. Follow these instructions at home: Medicines  Take or apply over-the-counter and prescription medicines only as told by your doctor.  Use a cotton-tip swab to apply creams or gels to your sores. Sore Care  Do not touch the sores or pick the scabs.  Wash your hands often. Do not touch your eyes without washing your hands first.  Keep the sores clean and dry.  If directed, apply ice to the sores:  Put ice in a plastic bag.  Place a towel between your skin and the bag.  Leave the ice on for 20 minutes, 2-3 times per day. Lifestyle  Do not kiss, have oral sex, or share personal items until your sores heal.  Eat a soft, bland diet. Avoid eating hot, cold, or salty foods. These can hurt your mouth.  Use a straw if it hurts to drink out of a glass.  Avoid the sun and limit your stress if these things trigger outbreaks. If sun causes cold sores, apply sunscreen on your lips before being out in the sun. Contact a doctor if:  You have symptoms for more than two weeks.  You have pus coming from the sores.  You have redness that is spreading.  You have pain or irritation in your eye.  You get sores on your genitals.  Your sores do not heal within two weeks.  You get cold sores often. Get help right away if:  You have a fever and your symptoms suddenly get worse.  You have a headache and confusion. This information is not intended to replace advice given to you by your health care  provider. Make sure you discuss any questions you have with your health care provider. Document Released: 06/30/2011 Document Revised: 06/06/2015 Document Reviewed: 10/19/2014 Elsevier Interactive Patient Education  2018 Elsevier Inc.  

## 2017-03-19 ENCOUNTER — Other Ambulatory Visit: Payer: Self-pay | Admitting: Family Medicine

## 2017-03-19 ENCOUNTER — Ambulatory Visit: Payer: Self-pay | Admitting: Family Medicine

## 2017-03-19 ENCOUNTER — Telehealth: Payer: Self-pay

## 2017-03-19 DIAGNOSIS — Z86718 Personal history of other venous thrombosis and embolism: Secondary | ICD-10-CM

## 2017-03-19 LAB — TSH: TSH: 2.73 u[IU]/mL (ref 0.450–4.500)

## 2017-03-19 MED FILL — XARELTO 20 MG TABLET: 20 | 30 days supply | Qty: 30 | Fill #0

## 2017-03-19 NOTE — Telephone Encounter (Signed)
Patient was called and informed of lab results. 

## 2017-03-22 ENCOUNTER — Other Ambulatory Visit: Payer: Self-pay | Admitting: *Deleted

## 2017-03-22 ENCOUNTER — Telehealth: Payer: Self-pay | Admitting: Interventional Cardiology

## 2017-03-22 DIAGNOSIS — I2109 ST elevation (STEMI) myocardial infarction involving other coronary artery of anterior wall: Secondary | ICD-10-CM

## 2017-03-22 DIAGNOSIS — I2510 Atherosclerotic heart disease of native coronary artery without angina pectoris: Secondary | ICD-10-CM

## 2017-03-22 DIAGNOSIS — R002 Palpitations: Secondary | ICD-10-CM

## 2017-03-22 LAB — LIPID PANEL
CHOL/HDL RATIO: 3.5 ratio (ref 0.0–5.0)
Cholesterol, Total: 102 mg/dL (ref 100–199)
HDL: 29 mg/dL — ABNORMAL LOW (ref >39)
LDL Calculated: 18 mg/dL (ref 0–99)
Triglycerides: 274 mg/dL — ABNORMAL HIGH (ref 0–149)
VLDL CHOLESTEROL CAL: 55 mg/dL — AB (ref 5–40)

## 2017-03-22 LAB — HEPATIC FUNCTION PANEL
ALBUMIN: 4.3 g/dL (ref 3.5–5.5)
ALK PHOS: 42 IU/L (ref 39–117)
ALT: 31 IU/L (ref 0–44)
AST: 19 IU/L (ref 0–40)
Bilirubin Total: 0.5 mg/dL (ref 0.0–1.2)
Bilirubin, Direct: 0.17 mg/dL (ref 0.00–0.40)
Total Protein: 6.4 g/dL (ref 6.0–8.5)

## 2017-03-22 MED FILL — ?AMLODIPINE BESYLATE 5 MG T: 5 MG | 30 days supply | Qty: 30 | Fill #1

## 2017-03-22 MED FILL — ?CLOPIDOGREL 75MG TABLET: 75 | 30 days supply | Qty: 30 | Fill #1

## 2017-03-22 MED FILL — ?METFORMIN HCL 500MG TABLET: 500 | 30 days supply | Qty: 120 | Fill #1

## 2017-03-22 MED FILL — glipiZIDE ER 10 MG TB24: 10 | 30 days supply | Qty: 30 | Fill #1

## 2017-03-22 NOTE — Telephone Encounter (Signed)
Left message for patient to call back  

## 2017-03-22 NOTE — Telephone Encounter (Signed)
Patient came in for lab this am and complained of heart skipping times  1 month.  It is bothering him while he is trying to rest at night.  He would like to wear a monitor before his appointment in April or move his appointment up to be seen earlier.

## 2017-03-22 NOTE — Telephone Encounter (Signed)
Patient calling back and states that he has been having episodes of his heart "skipping around" in his chest for about a month and a half. He denies chest pain, lightheadedness, dizziness, or SOB. Patient states that he just gets this nervous feeling when it happens. Patient states that it happens several times a day and is worse at night. Patient states that [facility] and they did an EKG that was normal. He states that he was not having the "skipped beats" at the time. Patient states that he has only been taking his metoprolol tartrate 50 mg once a day. Instructed the patient to take BID as directed. Patient states that he drinks 3 cups of coffee daily. Advised the patient to decrease his caffeine intake. Patient states that he has already cut back. Patient denies taking any stimulants or any new medicines. Patient states that his BP has been around 130/80. He does not monitor his HR at home. Patient is asking if he can wear a monitor. Made patient aware that I would discuss with Dr. Eldridge DaceVaranasi and call him back and let him know.

## 2017-03-23 NOTE — Telephone Encounter (Signed)
Left message for patient to call back  

## 2017-03-23 NOTE — Telephone Encounter (Signed)
Called and made patient aware that Dr. Eldridge DaceVaranasi was okay with ordering a 24 hour holter. Patient states that he restarted taking his metoprolol tartrate 50 mg BID like he was suppose to. He states that he has felt better today and that he has not felt any "skipped beats" today. Patient would like to hold off on ordering the monitor for now. Advised for the patient to continue to monitor and let us know if his symptoms change or worsen. Patient verbalized understanding and thanked me for the call.

## 2017-03-26 NOTE — Telephone Encounter (Signed)
Patient states that his "skipped beats" have returned and would like to order the 24 hour holter now. Made patient aware that the order has been placed and that he will receive a call to schedule [facility]. Patient verbalized understanding and thanked me for the call.

## 2017-03-26 NOTE — Addendum Note (Signed)
Addended by: Daleen BoURRIE, Batul Diego I on: 03/26/2017 02:11 PM   Modules accepted: Orders

## 2017-04-13 ENCOUNTER — Telehealth: Payer: Self-pay | Admitting: *Deleted

## 2017-04-13 ENCOUNTER — Ambulatory Visit (INDEPENDENT_AMBULATORY_CARE_PROVIDER_SITE_OTHER): Payer: Self-pay

## 2017-04-13 DIAGNOSIS — R002 Palpitations: Secondary | ICD-10-CM

## 2017-04-13 NOTE — Telephone Encounter (Signed)
Left message giving [employer] contact information [phone] and instructions to call company today with medical coverage information or to make discounted self pay arrangements.  [employer] available 8-5 CST.

## 2017-04-15 ENCOUNTER — Ambulatory Visit: Payer: Self-pay | Admitting: Interventional Cardiology

## 2017-04-15 MED FILL — ?CLOPIDOGREL 75MG TABLET: 75 | 30 days supply | Qty: 30 | Fill #2

## 2017-04-15 MED FILL — ATORVASTATIN 80 MG TABLET: 80 | 30 days supply | Qty: 30 | Fill #1

## 2017-04-15 MED FILL — LOSARTAN POTASSIUM 50 MG TA: 50 | 30 days supply | Qty: 30 | Fill #1

## 2017-04-15 MED FILL — ?METFORMIN HCL 500MG TABLET: 500 | 30 days supply | Qty: 120 | Fill #2

## 2017-04-15 MED FILL — ?AMLODIPINE BESYLATE 5 MG T: 5 MG | 30 days supply | Qty: 30 | Fill #2

## 2017-04-15 MED FILL — glipiZIDE ER 10 MG TB24: 10 | 30 days supply | Qty: 30 | Fill #2

## 2017-04-15 NOTE — Progress Notes (Signed)
Cardiology Office Note   Date:  04/16/2017   ID:  Jacob Cooper, DOB [birth year], MRN [id number]  PCP:  Hoy RegisterNewlin, Enobong, MD    No chief complaint on file.  CAD  Wt Readings from Last 3 Encounters:  04/16/17 221 lb (100.2 kg)  03/18/17 214 lb (97.1 kg)  02/19/17 213 lb 3.2 oz (96.7 kg)       History of Present Illness: Jacob Cooper is a 52 y.o. male  Who has a PMH of tobacco use, HTN, DM and DVT on Xarelto therapy and [facility]17 with chest pain and EKG showing inferior ST elevation. CODE STEMI was activated and he was taken to cath lab. He was found to have a 99% occluded mid RCA. This is a very tortuous vessel. He underwent successful PCI + DES placement to the mid RCA. There was mild, nonobstructive disease, no more than 25% CAD, in the LAD, Lcx and Ramus intermedius. He had residual thrombus burden in the distal RCA and was placed on Aggrastat for 18 hours as well as heparin. EF by 2D echo mildly reduced at 40-45%. He was placed on DAPT with ASA and Plavix. Xarelto continued given h/o recurrent DVTs. Plan is for triple therapy x 30 days, followed by discontinuation of ASA. He was also placed on high dose statin therapy. Lipid panel showed elevated TG at 472. Unable to calculate LDL given TG > 400. He was also placed on a BB and ARB. Hgb A1c showed poorly controlled DM at 10.4. His oral DM meds were continued at time of discharge and smoking cessation was strongly advised.  He had flu at the time of his MI in 12/18.   Denies : Chest pain. Dizziness. Leg edema. Nitroglycerin use. Orthopnea. Paroxysmal nocturnal dyspnea. Shortness of breath. Syncope.   He has palpitations if he drinks heavily.  He continues to smoke, and this can cause some chest discomfort.    He is exercising on a treadmill.  He is gradually increasing.  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      LVEF 40-45% by 12/18 echo.  Past Medical History:  Diagnosis  Date  . Coronary artery disease   . Diabetes mellitus without complication (HCC)   . Hyperlipidemia   . Hypertension   . Myocardial infarction (HCC)    12/18 PCI/DES x1 RCA, EF 40-45%    Past Surgical History:  Procedure Laterality Date  . CARDIAC CATHETERIZATION    . CORONARY/GRAFT ACUTE MI REVASCULARIZATION N/A 01/03/2017   Procedure: Coronary/Graft Acute MI Revascularization;  Surgeon: Corky CraftsVaranasi, Nikhil Osei S, MD;  [facility];  Service: Cardiovascular;  Laterality: N/A;  . HERNIA REPAIR    . LEFT HEART CATH AND CORONARY ANGIOGRAPHY N/A 01/03/2017   Procedure: LEFT HEART CATH AND CORONARY ANGIOGRAPHY;  Surgeon: Corky CraftsVaranasi, Zara Wendt S, MD;  [facility];  Service: Cardiovascular;  Laterality: N/A;  . TONSILLECTOMY       Current Outpatient Medications  Medication Sig Dispense Refill  . acyclovir ointment (ZOVIRAX) 5 % Apply 1 application topically every 6 (six) hours as needed. 30 g 0  . amLODipine (NORVASC) 5 MG tablet Take 1 tablet (5 mg total) by mouth daily. 30 tablet 3  . aspirin 81 MG chewable tablet Chew 1 tablet (81 mg total) by mouth daily.    Marland Kitchen. atorvastatin (LIPITOR) 80 MG tablet Take 1 tablet (80 mg total) by mouth  daily. 30 tablet 3  . clopidogrel (PLAVIX) 75 MG tablet Take 1 tablet (75 mg total) by mouth daily. 30 tablet 3  . glipiZIDE (GLUCOTROL XL) 10 MG 24 hr tablet Take 1 tablet (10 mg total) by mouth daily with breakfast. 30 tablet 3  . losartan (COZAAR) 50 MG tablet Take 1 tablet (50 mg total) by mouth daily. 30 tablet 3  . metFORMIN (GLUCOPHAGE) 500 MG tablet Take 2 tablets (1,000 mg total) by mouth 2 (two) times daily with a meal. 120 tablet 3  . nitroGLYCERIN (NITROSTAT) 0.4 MG SL tablet Place 1 tablet (0.4 mg total) under the tongue every 5 (five) minutes as needed. 25 tablet 3  . XARELTO 20 MG TABS tablet TAKE 1 TABLET BY MOUTH DAILY. 90 tablet 0  . metoprolol tartrate (LOPRESSOR) 50 MG tablet Take 1 tablet (50 mg total) by mouth 2  (two) times daily. 180 tablet 3   No current facility-administered medications for this visit.     Allergies:   Patient has no known allergies.    Social History:  The patient  reports that he has been smoking pipe.  He has smoked for the past 15.00 years. He quit smokeless tobacco use about 27 years ago. His smokeless tobacco use included chew. He reports that he drinks about 1.8 - 2.4 oz of alcohol per week. He reports that he does not use drugs.   Family History:  The patient's family history includes Diabetes in his maternal aunt.    ROS:  Please see the history of present illness.   Otherwise, review of systems are positive for feeling feverish today- father has been sick.   Wondering if we can give tamiflu.  All other systems are reviewed and negative.    PHYSICAL EXAM: VS:  BP (!) 144/88   Pulse 72   Ht 5\' 8"  (1.727 m)   Wt 221 lb (100.2 kg)   SpO2 97%   BMI 33.60 kg/m  , BMI Body mass index is 33.6 kg/m. GEN: Well nourished, well developed, in no acute distress  HEENT: normal  Neck: no JVD, carotid bruits, or masses Cardiac: RRR; no murmurs, rubs, or gallops,no edema  Respiratory:  clear to auscultation bilaterally, normal work of breathing GI: soft, nontender, nondistended, + BS MS: no deformity or atrophy  Skin: warm and dry, no rash Neuro:  Strength and sensation are intact Psych: euthymic mood, full affect    Recent Labs: 01/07/2017: BUN 12; Creatinine, Ser 0.68; Hemoglobin 16.1; Platelets 136; Potassium 3.7; Sodium 134 03/18/2017: TSH 2.730 03/22/2017: ALT 31   Lipid Panel    Component Value Date/Time   CHOL 102 03/22/2017 1026   TRIG 274 (H) 03/22/2017 1026   HDL 29 (L) 03/22/2017 1026   CHOLHDL 3.5 03/22/2017 1026   CHOLHDL 6.1 01/03/2017 1426   VLDL UNABLE TO CALCULATE IF TRIGLYCERIDE OVER 400 mg/dL 16/10/9602 5409   LDLCALC 18 03/22/2017 1026     Other studies Reviewed: Additional studies/ records that were reviewed today with results  demonstrating: Cath record reviewed.   ASSESSMENT AND PLAN:  1. CAD: No clear angina on medical therapy.  COntinue DAPT.  We discussed is meds and he should continue them. 2. Combined systolic and diastolic heart failure: He appears euvolemic.   3. Hyperlipidemia: COntinue atorvastatin.  TG elevated.   4. DM: Followed by PMD.  I asked him to see PMD regarding feverish feeling. 5. Tobacco abuse: He needs to stop smoking his pipe.  6. HTN: May need BP  med increased if it stays elevated.  He should check at home. May need to increase Cozaar if BP stays high.   7. Recurrent DVT: COntinue on Xarelto.    Current medicines are reviewed at length with the patient today.  The patient concerns regarding his medicines were addressed.  The following changes have been made:  No change  Labs/ tests ordered today include:  No orders of the defined types were placed in this encounter.   Recommend 150 minutes/week of aerobic exercise Low fat, low carb, high fiber diet recommended  Disposition:   FU in 6 months in [place] since that is closer for him   Signed, Lance Muss, MD  04/16/2017 10:39 AM    [facility] [address] [place], [place], [place]  [address] Phone: [phone]; Fax: [phone]

## 2017-04-16 ENCOUNTER — Encounter: Payer: Self-pay | Admitting: Interventional Cardiology

## 2017-04-16 ENCOUNTER — Ambulatory Visit (INDEPENDENT_AMBULATORY_CARE_PROVIDER_SITE_OTHER): Payer: Self-pay | Admitting: Interventional Cardiology

## 2017-04-16 VITALS — BP 144/88 | HR 72 | Ht 68.0 in | Wt 221.0 lb

## 2017-04-16 DIAGNOSIS — I5042 Chronic combined systolic (congestive) and diastolic (congestive) heart failure: Secondary | ICD-10-CM

## 2017-04-16 DIAGNOSIS — Z720 Tobacco use: Secondary | ICD-10-CM

## 2017-04-16 DIAGNOSIS — E782 Mixed hyperlipidemia: Secondary | ICD-10-CM

## 2017-04-16 DIAGNOSIS — I25118 Atherosclerotic heart disease of native coronary artery with other forms of angina pectoris: Secondary | ICD-10-CM

## 2017-04-16 DIAGNOSIS — E1159 Type 2 diabetes mellitus with other circulatory complications: Secondary | ICD-10-CM

## 2017-04-16 DIAGNOSIS — I10 Essential (primary) hypertension: Secondary | ICD-10-CM

## 2017-04-16 MED FILL — XARELTO 20 MG TABLET: 20 | 30 days supply | Qty: 30 | Fill #1

## 2017-04-16 NOTE — Patient Instructions (Signed)
Medication Instructions:  Your physician recommends that you continue on your current medications as directed. Please refer to the Current Medication list given to you today.   Labwork: None ordered  Testing/Procedures: None ordered  Follow-Up: Your physician wants you to follow-up in: 6 months in the [place] office. You will receive a reminder letter in the mail two months in advance. If you don't receive a letter, please call our office to schedule the follow-up appointment.   Any Other Special Instructions Will Be Listed Below (If Applicable).  Continue to check your Blood Pressure at home and let us know if it consistently above 130/80.   If you need a refill on your cardiac medications before your next appointment, please call your pharmacy.

## 2017-05-03 ENCOUNTER — Telehealth: Payer: Self-pay | Admitting: Cardiology

## 2017-05-03 ENCOUNTER — Telehealth: Payer: Self-pay | Admitting: Interventional Cardiology

## 2017-05-03 DIAGNOSIS — I25118 Atherosclerotic heart disease of native coronary artery with other forms of angina pectoris: Secondary | ICD-10-CM

## 2017-05-03 NOTE — Telephone Encounter (Signed)
Left message for patient to call back  

## 2017-05-03 NOTE — Telephone Encounter (Signed)
See other phone note from today.

## 2017-05-03 NOTE — Telephone Encounter (Signed)
New Message  Pt states that he is returning call for PA, [place]

## 2017-05-03 NOTE — Telephone Encounter (Signed)
Pt returning call to nurse or PA [place]

## 2017-05-03 NOTE — Telephone Encounter (Signed)
Echo and ETT ordered and scheduled for 4/24. Reviewed instructions with the patient. Patient verbalizes understanding and thanked me for the call.

## 2017-05-03 NOTE — Telephone Encounter (Addendum)
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

## 2017-05-05 ENCOUNTER — Ambulatory Visit (HOSPITAL_COMMUNITY): Payer: BLUE CROSS/BLUE SHIELD | Attending: Interventional Cardiology

## 2017-05-05 ENCOUNTER — Other Ambulatory Visit: Payer: Self-pay

## 2017-05-05 ENCOUNTER — Encounter (INDEPENDENT_AMBULATORY_CARE_PROVIDER_SITE_OTHER): Payer: Self-pay

## 2017-05-05 ENCOUNTER — Ambulatory Visit (INDEPENDENT_AMBULATORY_CARE_PROVIDER_SITE_OTHER): Payer: BLUE CROSS/BLUE SHIELD

## 2017-05-05 DIAGNOSIS — I252 Old myocardial infarction: Secondary | ICD-10-CM | POA: Insufficient documentation

## 2017-05-05 DIAGNOSIS — E119 Type 2 diabetes mellitus without complications: Secondary | ICD-10-CM | POA: Insufficient documentation

## 2017-05-05 DIAGNOSIS — Z86718 Personal history of other venous thrombosis and embolism: Secondary | ICD-10-CM | POA: Diagnosis not present

## 2017-05-05 DIAGNOSIS — F17200 Nicotine dependence, unspecified, uncomplicated: Secondary | ICD-10-CM | POA: Diagnosis not present

## 2017-05-05 DIAGNOSIS — I25118 Atherosclerotic heart disease of native coronary artery with other forms of angina pectoris: Secondary | ICD-10-CM | POA: Diagnosis not present

## 2017-05-05 DIAGNOSIS — I10 Essential (primary) hypertension: Secondary | ICD-10-CM | POA: Diagnosis not present

## 2017-05-05 LAB — EXERCISE TOLERANCE TEST
CHL CUP MPHR: 168 {beats}/min
CHL CUP RESTING HR STRESS: 94 {beats}/min
CHL RATE OF PERCEIVED EXERTION: 17
CSEPEDS: 0 s
Estimated workload: 8.5 METS
Exercise duration (min): 7 min
Peak HR: 151 {beats}/min
Percent HR: 89 %

## 2017-05-06 ENCOUNTER — Telehealth: Payer: Self-pay | Admitting: Interventional Cardiology

## 2017-05-06 NOTE — Telephone Encounter (Signed)
Returned call to patient. Made patient aware of preliminary results of echo and ETT. Made patient aware that Dr. Eldridge DaceVaranasi will be back in the office on Monday and we will have his final results and DOT letter available at that time. Patient verbalized understanding and thanked me for the call.

## 2017-05-06 NOTE — Telephone Encounter (Signed)
New message   Patient calling to confirm test results of ETT and Echo included on paperwork for DOT.

## 2017-05-17 ENCOUNTER — Other Ambulatory Visit: Payer: Self-pay | Admitting: Interventional Cardiology

## 2017-05-17 DIAGNOSIS — I10 Essential (primary) hypertension: Secondary | ICD-10-CM

## 2017-05-17 MED ORDER — LOSARTAN POTASSIUM 50 MG PO TABS: 50.0000 mg | ORAL_TABLET | Freq: Every day | ORAL | 3 refills | Status: DC

## 2017-05-19 MED FILL — glipiZIDE ER 10 MG TB24: 10 | 30 days supply | Qty: 30 | Fill #3

## 2017-05-19 MED FILL — XARELTO 20 MG TABLET: 20 | 30 days supply | Qty: 30 | Fill #2

## 2017-05-31 MED FILL — ?AMLODIPINE BESYLATE 5 MG T: 5 MG | 30 days supply | Qty: 30 | Fill #3

## 2017-05-31 MED FILL — ?CLOPIDOGREL 75MG TA: 75 | 30 days supply | Qty: 30 | Fill #3

## 2017-06-14 MED FILL — ?METFORMIN HCL 500MG TABLET: 500 | 30 days supply | Qty: 120 | Fill #3

## 2017-06-14 MED FILL — ATORVASTATIN 80 MG TABLET: 80 | 30 days supply | Qty: 30 | Fill #2

## 2017-06-15 ENCOUNTER — Other Ambulatory Visit: Payer: Self-pay | Admitting: Family Medicine

## 2017-06-15 DIAGNOSIS — Z86718 Personal history of other venous thrombosis and embolism: Secondary | ICD-10-CM

## 2017-06-15 MED FILL — XARELTO 20 MG TABLET: 20 | 30 days supply | Qty: 30 | Fill #0

## 2017-06-16 ENCOUNTER — Ambulatory Visit: Payer: Self-pay | Admitting: Family Medicine

## 2017-06-29 ENCOUNTER — Other Ambulatory Visit: Payer: Self-pay | Admitting: Family Medicine

## 2017-06-29 DIAGNOSIS — I10 Essential (primary) hypertension: Secondary | ICD-10-CM

## 2017-06-29 DIAGNOSIS — E1165 Type 2 diabetes mellitus with hyperglycemia: Secondary | ICD-10-CM

## 2017-06-29 DIAGNOSIS — Z955 Presence of coronary angioplasty implant and graft: Secondary | ICD-10-CM

## 2017-06-29 MED ORDER — AMLODIPINE BESYLATE 5 MG PO TABS: 5.0000 mg | ORAL_TABLET | Freq: Every day | ORAL | 3 refills | Status: DC

## 2017-06-29 MED FILL — glipiZIDE XL 10 MG TB24: 10 | 30 days supply | Qty: 30 | Fill #0

## 2017-06-29 MED FILL — ?CLOPIDOGREL 75MG TAB: 75 | 30 days supply | Qty: 30 | Fill #0 | Status: TO

## 2017-06-29 MED FILL — ?AMLODIPINE BESYLATE 5 MG T: 5 MG | 30 days supply | Qty: 30 | Fill #0

## 2017-07-17 ENCOUNTER — Other Ambulatory Visit: Payer: Self-pay | Admitting: Cardiology

## 2017-07-17 DIAGNOSIS — I2510 Atherosclerotic heart disease of native coronary artery without angina pectoris: Secondary | ICD-10-CM

## 2017-07-19 MED FILL — ATORVASTATIN 80 MG TABLET: 80 | 30 days supply | Qty: 30 | Fill #3

## 2017-07-19 MED FILL — LOSARTAN POTASSIUM 50 MG TA: 50 | 30 days supply | Qty: 30 | Fill #2

## 2017-07-19 MED FILL — XARELTO 20 MG TABLET: 20 | 30 days supply | Qty: 30 | Fill #1

## 2017-07-23 MED FILL — ?METFORMIN HCL 500MG TABS: 500 | 30 days supply | Qty: 120 | Fill #0

## 2017-07-26 MED FILL — ?CLOPIDOGREL 75MG TAB: 75 | 30 days supply | Qty: 30 | Fill #1 | Status: TO

## 2017-07-26 MED FILL — ?AMLODIPINE BESYLATE 5 MG T: 5 MG | 30 days supply | Qty: 30 | Fill #1

## 2017-07-26 MED FILL — glipiZIDE XL 10 MG TB24: 10 | 30 days supply | Qty: 30 | Fill #1

## 2017-08-20 ENCOUNTER — Other Ambulatory Visit: Payer: Self-pay | Admitting: Family Medicine

## 2017-08-20 DIAGNOSIS — I2510 Atherosclerotic heart disease of native coronary artery without angina pectoris: Secondary | ICD-10-CM

## 2017-08-20 MED FILL — LOSARTAN POTASSIUM 50 MG TA: 50 | 30 days supply | Qty: 30 | Fill #3 | Status: TO

## 2017-08-20 MED FILL — ATORVASTATIN 80 MG TABLET: 80 | 30 days supply | Qty: 30 | Fill #0 | Status: TO

## 2017-08-20 MED FILL — ?CLOPIDOGREL 75MG TA: 75 | 30 days supply | Qty: 30 | Fill #2 | Status: TO

## 2017-08-20 MED FILL — ?AMLODIPINE BESYLATE 5 MG T: 5 MG | 30 days supply | Qty: 30 | Fill #2

## 2017-08-20 MED FILL — ?GLIPIZIDE ER 10 MG TB24: 10 | 30 days supply | Qty: 30 | Fill #2

## 2017-08-23 MED FILL — XARELTO 20 MG TABLET: 20 | 30 days supply | Qty: 30 | Fill #2

## 2017-09-20 ENCOUNTER — Other Ambulatory Visit: Payer: Self-pay | Admitting: Family Medicine

## 2017-09-20 DIAGNOSIS — Z86718 Personal history of other venous thrombosis and embolism: Secondary | ICD-10-CM

## 2017-09-23 NOTE — Telephone Encounter (Signed)
Pt to be on Xarelto chronically d/t recurrent DVTs. He has not been seen since 03/18/17. Will fill for 1 month and inform patient to make an appointment before additional refills are sent.

## 2017-09-29 ENCOUNTER — Telehealth: Payer: Self-pay | Admitting: Family Medicine

## 2017-09-29 NOTE — Telephone Encounter (Signed)
[facility] called to request a medication refill on -Potassium  [facility] [place], [place] [id number]

## 2017-10-01 NOTE — Telephone Encounter (Signed)
The patient does not have a prescription for potassium. He may have been talking about his losartan which is commonly labeled as Losartan potassium, if that is the case Dr. Eldridge DaceVaranasi [facility] [address] [place].

## 2017-10-23 ENCOUNTER — Other Ambulatory Visit: Payer: Self-pay | Admitting: Family Medicine

## 2017-10-23 ENCOUNTER — Other Ambulatory Visit: Payer: Self-pay | Admitting: Internal Medicine

## 2017-10-23 DIAGNOSIS — E1165 Type 2 diabetes mellitus with hyperglycemia: Secondary | ICD-10-CM

## 2017-10-23 DIAGNOSIS — I10 Essential (primary) hypertension: Secondary | ICD-10-CM

## 2017-11-01 ENCOUNTER — Telehealth: Payer: Self-pay | Admitting: Interventional Cardiology

## 2017-11-01 NOTE — Telephone Encounter (Signed)
Returned call to [employer] at [employer]. She is asking if we received the results from the holter monitor the patient wore in April. Made her aware that we received the report and the doctor has reviewed it with the patient.

## 2017-11-01 NOTE — Telephone Encounter (Signed)
New Message   Jacob Cooper is calling on behalf of [employer] to ask whether or not the information received from the monitor is sufficient enough to make a determination.

## 2017-11-23 ENCOUNTER — Other Ambulatory Visit: Payer: Self-pay | Admitting: Family Medicine

## 2017-11-23 DIAGNOSIS — E1165 Type 2 diabetes mellitus with hyperglycemia: Secondary | ICD-10-CM

## 2017-11-23 DIAGNOSIS — I10 Essential (primary) hypertension: Secondary | ICD-10-CM

## 2017-11-23 DIAGNOSIS — Z86718 Personal history of other venous thrombosis and embolism: Secondary | ICD-10-CM

## 2017-12-13 ENCOUNTER — Ambulatory Visit: Payer: Self-pay | Admitting: Family Medicine

## 2017-12-25 ENCOUNTER — Other Ambulatory Visit: Payer: Self-pay | Admitting: Family Medicine

## 2017-12-25 DIAGNOSIS — Z86718 Personal history of other venous thrombosis and embolism: Secondary | ICD-10-CM

## 2017-12-25 DIAGNOSIS — E1165 Type 2 diabetes mellitus with hyperglycemia: Secondary | ICD-10-CM

## 2017-12-25 DIAGNOSIS — Z955 Presence of coronary angioplasty implant and graft: Secondary | ICD-10-CM

## 2017-12-25 DIAGNOSIS — I10 Essential (primary) hypertension: Secondary | ICD-10-CM

## 2017-12-25 DIAGNOSIS — I2510 Atherosclerotic heart disease of native coronary artery without angina pectoris: Secondary | ICD-10-CM

## 2017-12-27 ENCOUNTER — Telehealth: Payer: Self-pay | Admitting: Family Medicine

## 2017-12-27 DIAGNOSIS — Z86718 Personal history of other venous thrombosis and embolism: Secondary | ICD-10-CM

## 2017-12-27 DIAGNOSIS — E1165 Type 2 diabetes mellitus with hyperglycemia: Secondary | ICD-10-CM

## 2017-12-27 DIAGNOSIS — I10 Essential (primary) hypertension: Secondary | ICD-10-CM

## 2017-12-27 NOTE — Telephone Encounter (Signed)
1) Medication(s) Requested (by name): -amLODipine (NORVASC) 5 MG tablet  -XARELTO 20 MG TABS tablet  -glipiZIDE (GLUCOTROL XL) 10 MG 24 hr tablet  -metoprolol tartrate (LOPRESSOR) 50 MG tablet   2) Pharmacy of Choice: -[id number] - [place], [place] - [address]) Special Requests:   Approved medications will be sent to the pharmacy, we will reach out if there is an issue.  Requests made after 3pm may not be addressed until the following business day!  If a patient is unsure of the name of the medication(s) please note and ask patient to call back when they are able to provide all info, do not send to responsible party until all information is available!

## 2017-12-28 MED ORDER — METOPROLOL TARTRATE 50 MG PO TABS: 50.0000 mg | ORAL_TABLET | Freq: Two times a day (BID) | ORAL | 0 refills | Status: DC

## 2017-12-28 MED ORDER — GLIPIZIDE ER 10 MG PO TB24: ORAL_TABLET | ORAL | 0 refills | Status: DC

## 2017-12-28 MED ORDER — AMLODIPINE BESYLATE 5 MG PO TABS: ORAL_TABLET | ORAL | 0 refills | Status: DC

## 2017-12-28 MED ORDER — RIVAROXABAN 20 MG PO TABS: 20.0000 mg | ORAL_TABLET | Freq: Every day | ORAL | 0 refills | Status: DC

## 2017-12-28 NOTE — Telephone Encounter (Signed)
Refill sent to pharmacy.   

## 2017-12-31 ENCOUNTER — Other Ambulatory Visit: Payer: Self-pay | Admitting: Family Medicine

## 2017-12-31 ENCOUNTER — Telehealth: Payer: Self-pay | Admitting: Family Medicine

## 2017-12-31 DIAGNOSIS — I2510 Atherosclerotic heart disease of native coronary artery without angina pectoris: Secondary | ICD-10-CM

## 2017-12-31 DIAGNOSIS — Z955 Presence of coronary angioplasty implant and graft: Secondary | ICD-10-CM

## 2017-12-31 NOTE — Telephone Encounter (Signed)
Pharmacy reports they did receive the RX for xarelto, will forward additional refills to PCP

## 2017-12-31 NOTE — Telephone Encounter (Signed)
Routed electronic requests from the pharmacy to patients PCP, we are awaiting her decision

## 2017-12-31 NOTE — Telephone Encounter (Signed)
1) Medication(s) Requested (by name): Atorvastatin clopidogrel xarelto (patient was told it wasn't received) 2) Pharmacy of Choice: [facility] in [address] [place]

## 2018-01-23 ENCOUNTER — Other Ambulatory Visit: Payer: Self-pay | Admitting: Family Medicine

## 2018-01-23 DIAGNOSIS — I10 Essential (primary) hypertension: Secondary | ICD-10-CM

## 2018-01-24 ENCOUNTER — Other Ambulatory Visit: Payer: Self-pay | Admitting: Cardiology

## 2018-01-25 ENCOUNTER — Encounter: Payer: Self-pay | Admitting: Family Medicine

## 2018-01-25 ENCOUNTER — Ambulatory Visit: Payer: Self-pay | Attending: Family Medicine | Admitting: Family Medicine

## 2018-01-25 VITALS — BP 142/102 | HR 88 | Temp 98.1°F | Ht 68.0 in | Wt 224.6 lb

## 2018-01-25 DIAGNOSIS — Z7901 Long term (current) use of anticoagulants: Secondary | ICD-10-CM | POA: Insufficient documentation

## 2018-01-25 DIAGNOSIS — I10 Essential (primary) hypertension: Secondary | ICD-10-CM | POA: Insufficient documentation

## 2018-01-25 DIAGNOSIS — I2510 Atherosclerotic heart disease of native coronary artery without angina pectoris: Secondary | ICD-10-CM | POA: Insufficient documentation

## 2018-01-25 DIAGNOSIS — Z7984 Long term (current) use of oral hypoglycemic drugs: Secondary | ICD-10-CM | POA: Insufficient documentation

## 2018-01-25 DIAGNOSIS — I252 Old myocardial infarction: Secondary | ICD-10-CM | POA: Insufficient documentation

## 2018-01-25 DIAGNOSIS — Z7982 Long term (current) use of aspirin: Secondary | ICD-10-CM | POA: Insufficient documentation

## 2018-01-25 DIAGNOSIS — Z79899 Other long term (current) drug therapy: Secondary | ICD-10-CM | POA: Insufficient documentation

## 2018-01-25 DIAGNOSIS — Z7902 Long term (current) use of antithrombotics/antiplatelets: Secondary | ICD-10-CM | POA: Insufficient documentation

## 2018-01-25 DIAGNOSIS — E1165 Type 2 diabetes mellitus with hyperglycemia: Secondary | ICD-10-CM | POA: Insufficient documentation

## 2018-01-25 DIAGNOSIS — Z955 Presence of coronary angioplasty implant and graft: Secondary | ICD-10-CM | POA: Insufficient documentation

## 2018-01-25 DIAGNOSIS — I82401 Acute embolism and thrombosis of unspecified deep veins of right lower extremity: Secondary | ICD-10-CM

## 2018-01-25 DIAGNOSIS — Z86718 Personal history of other venous thrombosis and embolism: Secondary | ICD-10-CM | POA: Insufficient documentation

## 2018-01-25 DIAGNOSIS — E785 Hyperlipidemia, unspecified: Secondary | ICD-10-CM | POA: Insufficient documentation

## 2018-01-25 LAB — POCT GLYCOSYLATED HEMOGLOBIN (HGB A1C): Hemoglobin A1C: 8 % — AB (ref 4.0–5.6)

## 2018-01-25 LAB — GLUCOSE, POCT (MANUAL RESULT ENTRY): POC Glucose: 194 mg/dl — AB (ref 70–99)

## 2018-01-25 MED ORDER — RIVAROXABAN 20 MG PO TABS: 20.0000 mg | ORAL_TABLET | Freq: Every day | ORAL | 6 refills | Status: DC

## 2018-01-25 MED ORDER — METOPROLOL TARTRATE 50 MG PO TABS: 50.0000 mg | ORAL_TABLET | Freq: Two times a day (BID) | ORAL | 6 refills | Status: DC

## 2018-01-25 MED ORDER — METFORMIN HCL 500 MG PO TABS: 1000.0000 mg | ORAL_TABLET | Freq: Two times a day (BID) | ORAL | 6 refills | Status: DC

## 2018-01-25 MED ORDER — GLIPIZIDE ER 10 MG PO TB24: ORAL_TABLET | ORAL | 6 refills | Status: DC

## 2018-01-25 MED ORDER — ATORVASTATIN CALCIUM 80 MG PO TABS: 80.0000 mg | ORAL_TABLET | Freq: Every day | ORAL | 6 refills | Status: DC

## 2018-01-25 MED ORDER — LOSARTAN POTASSIUM 100 MG PO TABS: 100.0000 mg | ORAL_TABLET | Freq: Every day | ORAL | 6 refills | Status: DC

## 2018-01-25 NOTE — Progress Notes (Signed)
Subjective:  Patient ID: Jacob Cooper, male    DOB: July 21, 1965  Age: 53 y.o. MRN: [id number]  CC: Diabetes and Hypertension   HPI Marisol GREGROY DOMBKOWSKI is a 53 year old male with a history of Type Diabetes Mellitus (A1c 8.0), Hypertension, recurrent DVT recently hospitalized at [facility] from 01/03/17 - 01/07/17 for STEMI, cardiac cath revealed 99% occluded mid RCA, he underwent DES to mid RCA. Echocardiogram revealed EF of 40-45%.   He is concerned about taking too many medications.  Chart reviewed and in 12/2017 he had completed 12 months of antiplatelet therapy with Plavix and aspirin and also takes Xarelto due to his chronic DVTs. He is yet to see his cardiologist for follow-up.  With regards to his diabetes he has been taking 500 right some 1000 mg of metformin twice daily but has been compliant with glipizide.  He is planning to start exercising so he can lose some weight.  Denies numbness in extremities, visual concerns. Denies chest pains, shortness of breath, pedal edema.  Past Medical History:  Diagnosis Date  . Coronary artery disease   . Diabetes mellitus without complication ([place])   . Hyperlipidemia   . Hypertension   . Myocardial infarction ([place])    12/18 PCI/DES x1 RCA, EF 40-45%    Past Surgical History:  Procedure Laterality Date  . CARDIAC CATHETERIZATION    . CORONARY/GRAFT ACUTE MI REVASCULARIZATION N/A 01/03/2017   Procedure: Coronary/Graft Acute MI Revascularization;  Surgeon: Jettie Booze, MD;  Location: [place];  Service: Cardiovascular;  Laterality: N/A;  . HERNIA REPAIR    . LEFT HEART CATH AND CORONARY ANGIOGRAPHY N/A 01/03/2017   Procedure: LEFT HEART CATH AND CORONARY ANGIOGRAPHY;  Surgeon: Jettie Booze, MD;  Location: [place];  Service: Cardiovascular;  Laterality: N/A;  . TONSILLECTOMY      No Known Allergies  Outpatient Medications Prior to Visit  Medication Sig Dispense Refill  . acyclovir ointment  (ZOVIRAX) 5 % Apply 1 application topically every 6 (six) hours as needed. 30 g 0  . aspirin 81 MG chewable tablet Chew 1 tablet (81 mg total) by mouth daily.    . nitroGLYCERIN (NITROSTAT) 0.4 MG SL tablet Place 1 tablet (0.4 mg total) under the tongue every 5 (five) minutes as needed. 25 tablet 3  . amLODipine (NORVASC) 5 MG tablet TAKE 1 TABLET BY MOUTH ONCE DAILY MUST  MAKE  APPOINTMENT  FOR  FURTHER  REFILLS 30 tablet 0  . atorvastatin (LIPITOR) 80 MG tablet TAKE 1 TABLET BY MOUTH ONCE DAILY 30 tablet 0  . clopidogrel (PLAVIX) 75 MG tablet TAKE 1 TABLET BY MOUTH ONCE DAILY 30 tablet 0  . glipiZIDE (GLUCOTROL XL) 10 MG 24 hr tablet TAKE 1 TABLET BY MOUTH ONCE DAILY WITH  BREAKFAST.  MUST  MAKE  APPOINTMENT  FOR  FURTHER  REFILLS 30 tablet 0  . losartan (COZAAR) 50 MG tablet Take 1 tablet (50 mg total) by mouth daily. 90 tablet 3  . metFORMIN (GLUCOPHAGE) 500 MG tablet TAKE 2 TABLETS BY MOUTH 2 TIMES DAILY WITH A MEAL. 120 tablet 3  . metoprolol tartrate (LOPRESSOR) 50 MG tablet TAKE 1 TABLET BY MOUTH TWICE DAILY 60 tablet 3  . rivaroxaban (XARELTO) 20 MG TABS tablet Take 1 tablet (20 mg total) by mouth daily. 30 tablet 0   No facility-administered medications prior to visit.     ROS Review of Systems  Constitutional: Negative for activity change and appetite change.  HENT: Negative for sinus pressure and sore throat.   Eyes: Negative for visual disturbance.  Respiratory: Negative for cough, chest tightness and shortness of breath.   Cardiovascular: Negative for chest pain and leg swelling.  Gastrointestinal: Negative for abdominal distention, abdominal pain, constipation and diarrhea.  Endocrine: Negative.   Genitourinary: Negative for dysuria.  Musculoskeletal: Negative for joint swelling and myalgias.  Skin: Negative for rash.  Allergic/Immunologic: Negative.   Neurological: Negative for weakness, light-headedness and numbness.  Psychiatric/Behavioral: Negative for dysphoric mood  and suicidal ideas.    Objective:  BP (!) 142/102   Pulse 88   Temp 98.1 F (36.7 C) (Oral)   Ht 5' 8"  (1.727 m)   Wt 224 lb 9.6 oz (101.9 kg)   SpO2 95%   BMI 34.15 kg/m   BP/Weight 01/25/2018 0/03/7046 08/20/9167  Systolic BP 450 388 828  Diastolic BP 003 88 81  Wt. (Lbs) 224.6 221 214  BMI 34.15 33.6 32.54      Physical Exam Constitutional:      Appearance: He is well-developed.  Cardiovascular:     Rate and Rhythm: Normal rate.     Heart sounds: Normal heart sounds. No murmur.  Pulmonary:     Effort: Pulmonary effort is normal.     Breath sounds: Normal breath sounds. No wheezing or rales.  Chest:     Chest wall: No tenderness.  Abdominal:     General: Bowel sounds are normal. There is no distension.     Palpations: Abdomen is soft. There is no mass.     Tenderness: There is no abdominal tenderness.  Musculoskeletal: Normal range of motion.  Neurological:     Mental Status: He is alert and oriented to person, place, and time.      CMP Latest Ref Rng & Units 03/22/2017 01/07/2017 01/06/2017  Glucose 65 - 99 mg/dL - 263(H) 242(H)  BUN 6 - 20 mg/dL - 12 12  Creatinine 0.61 - 1.24 mg/dL - 0.68 0.63  Sodium 135 - 145 mmol/L - 134(L) 133(L)  Potassium 3.5 - 5.1 mmol/L - 3.7 3.6  Chloride 101 - 111 mmol/L - 101 101  CO2 22 - 32 mmol/L - 23 21(L)  Calcium 8.9 - 10.3 mg/dL - 8.1(L) 8.1(L)  Total Protein 6.0 - 8.5 g/dL 6.4 - -  Total Bilirubin 0.0 - 1.2 mg/dL 0.5 - -  Alkaline Phos 39 - 117 IU/L 42 - -  AST 0 - 40 IU/L 19 - -  ALT 0 - 44 IU/L 31 - -    Lipid Panel     Component Value Date/Time   CHOL 102 03/22/2017 1026   TRIG 274 (H) 03/22/2017 1026   HDL 29 (L) 03/22/2017 1026   CHOLHDL 3.5 03/22/2017 1026   CHOLHDL 6.1 01/03/2017 1426   VLDL UNABLE TO CALCULATE IF TRIGLYCERIDE OVER 400 mg/dL 01/03/2017 1426   LDLCALC 18 03/22/2017 1026    Lab Results  Component Value Date   HGBA1C 8.0 (A) 01/25/2018     Assessment & Plan:   1. Type 2 diabetes  mellitus with hyperglycemia, without long-term current use of insulin (HCC) Uncontrolled with A1c of 8.0 He has been taking 500 rather than 1000 mg of metformin twice daily and has been advised to take medication as prescribed Diabetic diet, lifestyle modifications - POCT glucose (manual entry) - POCT glycosylated hemoglobin (Hb A1C) - CMP14+EGFR - Lipid panel - glipiZIDE (GLUCOTROL XL) 10 MG 24 hr tablet; TAKE 1 TABLET BY MOUTH ONCE DAILY WITH  BREAKFAST.  Dispense: 30 tablet; Refill: 6 - metFORMIN (GLUCOPHAGE) 500 MG tablet; Take 2 tablets (1,000 mg total) by mouth 2 (two) times daily with a meal.  Dispense: 120 tablet; Refill: 6 - Microalbumin/Creatinine Ratio, Urine  2. Essential hypertension Uncontrolled Increase losartan dose; discontinued amlodipine to reduce polypharmacy Counseled on blood pressure goal of less than 130/80, low-sodium, DASH diet, medication compliance, 150 minutes of moderate intensity exercise per week. Discussed medication compliance, adverse effects. - losartan (COZAAR) 100 MG tablet; Take 1 tablet (100 mg total) by mouth daily.  Dispense: 30 tablet; Refill: 6 - metoprolol tartrate (LOPRESSOR) 50 MG tablet; Take 1 tablet (50 mg total) by mouth 2 (two) times daily.  Dispense: 60 tablet; Refill: 6  3. Coronary artery disease involving native coronary artery of native heart without angina pectoris Completed 12 months of dual antiplatelet therapy (Plavix and aspirin) Discontinue Plavix Continue aspirin - atorvastatin (LIPITOR) 80 MG tablet; Take 1 tablet (80 mg total) by mouth daily.  Dispense: 30 tablet; Refill: 6  4. 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  Dispense: 30 tablet; Refill: 6   Meds ordered this encounter  Medications  . losartan (COZAAR) 100 MG tablet    Sig: Take 1 tablet (100 mg total) by  mouth daily.    Dispense:  30 tablet    Refill:  6    Dose change  . atorvastatin (LIPITOR) 80 MG tablet    Sig: Take 1 tablet (80 mg total) by mouth daily.    Dispense:  30 tablet    Refill:  6  . glipiZIDE (GLUCOTROL XL) 10 MG 24 hr tablet    Sig: TAKE 1 TABLET BY MOUTH ONCE DAILY WITH  BREAKFAST.    Dispense:  30 tablet    Refill:  6    Please consider 90 day supplies to promote better adherence  . metFORMIN (GLUCOPHAGE) 500 MG tablet    Sig: Take 2 tablets (1,000 mg total) by mouth 2 (two) times daily with a meal.    Dispense:  120 tablet    Refill:  6  . metoprolol tartrate (LOPRESSOR) 50 MG tablet    Sig: Take 1 tablet (50 mg total) by mouth 2 (two) times daily.    Dispense:  60 tablet    Refill:  6  . rivaroxaban (XARELTO) 20 MG TABS tablet    Sig: Take 1 tablet (20 mg total) by mouth daily.    Dispense:  30 tablet    Refill:  6    Please consider 90 day supplies to promote better adherence    Follow-up: Return in about 3 months (around 04/26/2018) for Follow-up of chronic medical conditions.   Charlott Rakes MD

## 2018-01-26 LAB — CMP14+EGFR
ALT: 45 IU/L — ABNORMAL HIGH (ref 0–44)
AST: 28 IU/L (ref 0–40)
Albumin/Globulin Ratio: 2.2 (ref 1.2–2.2)
Albumin: 4.8 g/dL (ref 3.5–5.5)
Alkaline Phosphatase: 46 IU/L (ref 39–117)
BUN/Creatinine Ratio: 19 (ref 9–20)
BUN: 13 mg/dL (ref 6–24)
Bilirubin Total: 0.4 mg/dL (ref 0.0–1.2)
CO2: 21 mmol/L (ref 20–29)
Calcium: 9.9 mg/dL (ref 8.7–10.2)
Chloride: 102 mmol/L (ref 96–106)
Creatinine, Ser: 0.69 mg/dL — ABNORMAL LOW (ref 0.76–1.27)
GFR calc Af Amer: 125 mL/min/{1.73_m2} (ref >59)
GFR calc non Af Amer: 108 mL/min/{1.73_m2} (ref >59)
Globulin, Total: 2.2 g/dL (ref 1.5–4.5)
Glucose: 177 mg/dL — ABNORMAL HIGH (ref 65–99)
Potassium: 4.5 mmol/L (ref 3.5–5.2)
Sodium: 143 mmol/L (ref 134–144)
Total Protein: 7 g/dL (ref 6.0–8.5)

## 2018-01-26 LAB — LIPID PANEL
Chol/HDL Ratio: 2.9 ratio (ref 0.0–5.0)
Cholesterol, Total: 91 mg/dL — ABNORMAL LOW (ref 100–199)
HDL: 31 mg/dL — ABNORMAL LOW
LDL Calculated: 12 mg/dL (ref 0–99)
Triglycerides: 241 mg/dL — ABNORMAL HIGH (ref 0–149)
VLDL Cholesterol Cal: 48 mg/dL — ABNORMAL HIGH (ref 5–40)

## 2018-01-26 LAB — MICROALBUMIN / CREATININE URINE RATIO
Creatinine, Urine: 201.4 mg/dL
Microalb/Creat Ratio: 22.3 mg/g{creat} (ref 0.0–30.0)
Microalbumin, Urine: 45 ug/mL

## 2018-01-28 ENCOUNTER — Telehealth: Payer: Self-pay

## 2018-01-28 NOTE — Telephone Encounter (Signed)
-----   Message from Hoy Register, MD sent at 01/26/2018  5:22 PM EST ----- Kidney, and liver functions are normal, total cholesterol is normal however triglycerides are still elevated.  Please advised to commence OTC fish oil capsules

## 2018-01-28 NOTE — Telephone Encounter (Signed)
Patient was called and informed of lab results via voicemail. 

## 2018-03-09 ENCOUNTER — Ambulatory Visit: Payer: Self-pay | Attending: Family Medicine | Admitting: Emergency Medicine

## 2018-03-09 DIAGNOSIS — Z23 Encounter for immunization: Secondary | ICD-10-CM

## 2018-03-09 NOTE — Progress Notes (Signed)
Patient presented for the Pneumovax 23 vaccine.  Patient was alert and orientated and in no apparent distress.  Patient filled out the required forms.  Patient was given vaccine.  Patient advised to wait for 15 minutes to see if he had a reaction and if not he could leave.

## 2018-03-30 ENCOUNTER — Telehealth: Payer: Self-pay | Admitting: Family Medicine

## 2018-03-30 NOTE — Telephone Encounter (Signed)
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 Please call patient at [phone] He uses [facility] in [place] [phone]

## 2018-03-30 NOTE — Telephone Encounter (Signed)
[facility] states that the patient picked up all RX's for a thirty day supply on 03/26/18. I let the patient know that is why it cannot be refilled, the insurance is not allowing an early refill override. He was very concerned that pharmacies will be running out of medications in the coming weeks. I let him know that as of right now there is no way around this without him having to pay cash for the refills.

## 2018-04-14 ENCOUNTER — Telehealth: Payer: Self-pay | Admitting: Cardiology

## 2018-04-14 NOTE — Telephone Encounter (Signed)
FYI.  °Contacted patient regarding recall appointment, patient notified our office they did not wish to keep this appointment at this time.  Deleted recall from system. °

## 2018-05-02 ENCOUNTER — Ambulatory Visit: Payer: Self-pay | Admitting: Family Medicine

## 2018-05-31 ENCOUNTER — Telehealth: Payer: Self-pay | Admitting: Family Medicine

## 2018-05-31 NOTE — Telephone Encounter (Signed)
Patient called stating his BP has been running high and that he would like to be prescribed something to help lower his Bp patient states the lowest bp he has had is  170/139

## 2018-05-31 NOTE — Telephone Encounter (Addendum)
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  Per patient He has taken 4 metoprolol in the last 24 hours  Appointment scheduled for tomorrow for patient.  Advised to go to UC or ED if he has chest pain, SOB, dizziness, or if blood pressure are too high. Pt verbalized understanding.

## 2018-06-01 ENCOUNTER — Other Ambulatory Visit: Payer: Self-pay

## 2018-06-01 ENCOUNTER — Telehealth: Payer: Self-pay | Admitting: Family Medicine

## 2018-06-01 ENCOUNTER — Ambulatory Visit: Payer: Self-pay | Attending: Family Medicine | Admitting: Physician Assistant

## 2018-06-01 DIAGNOSIS — I10 Essential (primary) hypertension: Secondary | ICD-10-CM

## 2018-06-01 DIAGNOSIS — E1165 Type 2 diabetes mellitus with hyperglycemia: Secondary | ICD-10-CM

## 2018-06-01 MED ORDER — LOSARTAN POTASSIUM-HCTZ 100-25 MG PO TABS: 1.0000 | ORAL_TABLET | Freq: Every day | ORAL | 3 refills | Status: DC

## 2018-06-01 NOTE — Telephone Encounter (Signed)
1) Medication(s) Requested (by name): losartan-hydrochlorothiazide  Pharmacy called stating this medication is on back order and would like to know if the patient can be prescribed  2) Pharmacy of Choice: [facility] in [place]

## 2018-06-01 NOTE — Progress Notes (Signed)
BP reading today - 197/137  BP: 203/107 P: 105 Has not taken medication as of yet Elevated blood pressure.  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

## 2018-06-01 NOTE — Progress Notes (Signed)
Patient ID: Jacob Cooper, male   DOB: 1965/12/17, 53 y.o.   MRN: [id number] Virtual Visit via Telephone Note  I connected with Jacob Cooper on 06/01/18 at  2:50 PM EDT by telephone and verified that I am speaking with the correct person using two identifiers.   I discussed the limitations, risks, security and privacy concerns of performing an evaluation and management service by telephone and the availability of in person appointments. I also discussed with the patient that there may be a patient responsible charge related to this service. The patient expressed understanding and agreed to proceed.  Patient location:  Home  My Location:  [facility] office Persons on the call:  Patient and myself    History of Present Illness: Patient c/o high BP.  BP running ~200/100.  Not always compliant with meds and sometimes doubles up on his meds.  Denies HA/Dizziness/CP.  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  Adamant that I have to give him something that fixes his BP by then.  Says he is working on weight loss and exercise.     Observations/Objective:  A&Ox3.  Pressured speech at times.  But, speech is clear   Assessment and Plan: 1. Essential hypertension Uncontrolled.  Continue metoprolol 50mg  bid.  Stop plain Losartan 100mg .   - losartan-hydrochlorothiazide (HYZAAR) 100-25 MG tablet; Take 1 tablet by mouth daily.  Dispense: 90 tablet; Refill: 3 -do NOT self-adjust meds-discussed the dangers of this at length!! We have discussed target BP range and blood pressure goal. I have advised patient to check BP regularly and to call us back or report to clinic if the numbers are consistently higher than 140/90. We discussed the importance of compliance with medical therapy and DASH diet recommended, consequences of uncontrolled hypertension discussed.  -check BP bid and record and bring to next visit.    2. Type 2 diabetes mellitus  with hyperglycemia, without long-term current use of insulin (HCC) Continue current regiment and continue to work on diet/exercise    Follow Up Instructions: 1 month with PCP.  Call 911 if any CVA/CV s/sx or symptomatic in any way   I discussed the assessment and treatment plan with the patient. The patient was provided an opportunity to ask questions and all were answered. The patient agreed with the plan and demonstrated an understanding of the instructions.   The patient was advised to call back or seek an in-person evaluation if the symptoms worsen or if the condition fails to improve as anticipated.  I provided 16 minutes of non-face-to-face time during this encounter.   Georgian Co, PA-C

## 2018-06-02 ENCOUNTER — Other Ambulatory Visit: Payer: Self-pay | Admitting: Physician Assistant

## 2018-06-02 DIAGNOSIS — I10 Essential (primary) hypertension: Secondary | ICD-10-CM

## 2018-06-02 MED ORDER — HYDROCHLOROTHIAZIDE 25 MG PO TABS: 25.0000 mg | ORAL_TABLET | Freq: Every day | ORAL | 3 refills | Status: DC

## 2018-06-02 MED ORDER — LOSARTAN POTASSIUM 100 MG PO TABS: 100.0000 mg | ORAL_TABLET | Freq: Every day | ORAL | 3 refills | Status: DC

## 2018-06-02 NOTE — Telephone Encounter (Signed)
I had to send it as 2 separate prescriptions since it isn't available as a combination tablet currently.  I sent it to [facility].  Thanks, Georgian Co, PA-C

## 2018-06-02 NOTE — Telephone Encounter (Signed)
Will route to NP.

## 2018-06-25 DIAGNOSIS — R05 Cough: Secondary | ICD-10-CM | POA: Diagnosis not present

## 2018-06-28 ENCOUNTER — Other Ambulatory Visit: Payer: Self-pay

## 2018-06-28 ENCOUNTER — Ambulatory Visit: Payer: Self-pay | Attending: Family Medicine | Admitting: Family Medicine

## 2018-07-11 ENCOUNTER — Telehealth: Payer: Self-pay | Admitting: Family Medicine

## 2018-07-11 NOTE — Telephone Encounter (Signed)
Pt states still needs higher medication to control BP pt states 150s over 120s. Losartan is not making a significant change per pt. Pt states has improved but not nearly enough. Please advise.

## 2018-07-11 NOTE — Telephone Encounter (Signed)
Patient had a televisit with angela on 06/01/2018

## 2018-07-11 NOTE — Telephone Encounter (Signed)
Patient called back to report his BP was 176/124

## 2018-07-12 ENCOUNTER — Other Ambulatory Visit: Payer: Self-pay

## 2018-07-12 MED ORDER — AMLODIPINE BESYLATE 5 MG PO TABS: 5.0000 mg | ORAL_TABLET | Freq: Every day | ORAL | 1 refills | Status: DC

## 2018-07-12 NOTE — Telephone Encounter (Signed)
I have sent a prescription for amlodipine to his pharmacy.

## 2018-07-12 NOTE — Telephone Encounter (Signed)
Patient was called and informed of medication being sent to pharmacy. 

## 2018-07-27 ENCOUNTER — Other Ambulatory Visit: Payer: Self-pay | Admitting: Pharmacist

## 2018-07-27 DIAGNOSIS — I82401 Acute embolism and thrombosis of unspecified deep veins of right lower extremity: Secondary | ICD-10-CM

## 2018-07-27 MED ORDER — RIVAROXABAN 20 MG PO TABS: 20.0000 mg | ORAL_TABLET | Freq: Every day | ORAL | 0 refills | Status: DC

## 2018-07-28 ENCOUNTER — Other Ambulatory Visit: Payer: Self-pay | Admitting: Family Medicine

## 2018-07-28 DIAGNOSIS — E1165 Type 2 diabetes mellitus with hyperglycemia: Secondary | ICD-10-CM

## 2018-08-02 ENCOUNTER — Other Ambulatory Visit: Payer: Self-pay | Admitting: Pharmacist

## 2018-08-02 DIAGNOSIS — I2510 Atherosclerotic heart disease of native coronary artery without angina pectoris: Secondary | ICD-10-CM

## 2018-08-02 MED ORDER — ATORVASTATIN CALCIUM 80 MG PO TABS: 80.0000 mg | ORAL_TABLET | Freq: Every day | ORAL | 0 refills | Status: DC

## 2018-08-24 ENCOUNTER — Other Ambulatory Visit: Payer: Self-pay | Admitting: Family Medicine

## 2018-08-24 DIAGNOSIS — I82401 Acute embolism and thrombosis of unspecified deep veins of right lower extremity: Secondary | ICD-10-CM

## 2018-09-22 ENCOUNTER — Other Ambulatory Visit: Payer: Self-pay

## 2018-09-22 ENCOUNTER — Encounter: Payer: Self-pay | Admitting: Family Medicine

## 2018-09-22 ENCOUNTER — Ambulatory Visit: Payer: BC Managed Care – PPO | Attending: Family Medicine | Admitting: Family Medicine

## 2018-09-22 VITALS — BP 149/97 | HR 84 | Temp 98.2°F | Ht 68.0 in | Wt 215.8 lb

## 2018-09-22 DIAGNOSIS — F17200 Nicotine dependence, unspecified, uncomplicated: Secondary | ICD-10-CM

## 2018-09-22 DIAGNOSIS — Z91199 Patient's noncompliance with other medical treatment and regimen due to unspecified reason: Secondary | ICD-10-CM

## 2018-09-22 DIAGNOSIS — R058 Other specified cough: Secondary | ICD-10-CM

## 2018-09-22 DIAGNOSIS — Z955 Presence of coronary angioplasty implant and graft: Secondary | ICD-10-CM

## 2018-09-22 DIAGNOSIS — I10 Essential (primary) hypertension: Secondary | ICD-10-CM

## 2018-09-22 DIAGNOSIS — E1165 Type 2 diabetes mellitus with hyperglycemia: Secondary | ICD-10-CM | POA: Diagnosis not present

## 2018-09-22 DIAGNOSIS — I2510 Atherosclerotic heart disease of native coronary artery without angina pectoris: Secondary | ICD-10-CM

## 2018-09-22 DIAGNOSIS — R911 Solitary pulmonary nodule: Secondary | ICD-10-CM

## 2018-09-22 DIAGNOSIS — Z7902 Long term (current) use of antithrombotics/antiplatelets: Secondary | ICD-10-CM | POA: Diagnosis not present

## 2018-09-22 DIAGNOSIS — I82401 Acute embolism and thrombosis of unspecified deep veins of right lower extremity: Secondary | ICD-10-CM | POA: Diagnosis not present

## 2018-09-22 DIAGNOSIS — Z9119 Patient's noncompliance with other medical treatment and regimen: Secondary | ICD-10-CM

## 2018-09-22 DIAGNOSIS — R05 Cough: Secondary | ICD-10-CM

## 2018-09-22 DIAGNOSIS — IMO0001 Reserved for inherently not codable concepts without codable children: Secondary | ICD-10-CM

## 2018-09-22 DIAGNOSIS — L02422 Furuncle of left axilla: Secondary | ICD-10-CM

## 2018-09-22 LAB — POCT GLYCOSYLATED HEMOGLOBIN (HGB A1C): HbA1c, POC (controlled diabetic range): 9.4 % — AB (ref 0.0–7.0)

## 2018-09-22 MED ORDER — AMOXICILLIN-POT CLAVULANATE 500-125 MG PO TABS: 1.0000 | ORAL_TABLET | Freq: Two times a day (BID) | ORAL | 0 refills | Status: DC

## 2018-09-22 MED ORDER — ALBUTEROL SULFATE HFA 108 (90 BASE) MCG/ACT IN AERS: 2.0000 | INHALATION_SPRAY | Freq: Four times a day (QID) | RESPIRATORY_TRACT | 3 refills | Status: DC | PRN

## 2018-09-22 MED ORDER — RIVAROXABAN 20 MG PO TABS: 20.0000 mg | ORAL_TABLET | Freq: Every day | ORAL | 1 refills | Status: DC

## 2018-09-22 MED ORDER — ATORVASTATIN CALCIUM 80 MG PO TABS: 80.0000 mg | ORAL_TABLET | Freq: Every day | ORAL | 1 refills | Status: DC

## 2018-09-22 MED ORDER — NITROGLYCERIN 0.4 MG SL SUBL: 0.4000 mg | SUBLINGUAL_TABLET | SUBLINGUAL | 3 refills | Status: AC | PRN

## 2018-09-22 MED ORDER — LOSARTAN POTASSIUM 100 MG PO TABS: 100.0000 mg | ORAL_TABLET | Freq: Every day | ORAL | 1 refills | Status: DC

## 2018-09-22 MED ORDER — METOPROLOL TARTRATE 50 MG PO TABS: 50.0000 mg | ORAL_TABLET | Freq: Two times a day (BID) | ORAL | 1 refills | Status: DC

## 2018-09-22 MED ORDER — AMLODIPINE BESYLATE 5 MG PO TABS: 5.0000 mg | ORAL_TABLET | Freq: Every day | ORAL | 1 refills | Status: DC

## 2018-09-22 MED ORDER — AMLODIPINE BESYLATE 10 MG PO TABS: 10.0000 mg | ORAL_TABLET | Freq: Every day | ORAL | 1 refills | Status: DC

## 2018-09-22 MED ORDER — GLIPIZIDE ER 10 MG PO TB24: ORAL_TABLET | ORAL | 0 refills | Status: DC

## 2018-09-22 MED FILL — METOPROLOL TARTRATE 50 MG T: 50 | 90 days supply | Qty: 180 | Fill #0

## 2018-09-22 NOTE — Patient Instructions (Signed)
Coronary Artery Disease, Male Coronary artery disease (CAD) is a condition in which the arteries that lead to the heart (coronary arteries) become narrow or blocked. The narrowing or blockage can lead to decreased blood flow to the heart. Prolonged reduced blood flow can cause a heart attack (myocardial infarction or MI). This condition may also be called coronary heart disease. Because CAD is the leading cause of death in men, it is important to understand what causes this condition and how it is treated. What are the causes? CAD is most often caused by atherosclerosis. This is the buildup of fat and cholesterol (plaque) on the inside of the arteries. Over time, the plaque may narrow or block the artery, reducing blood flow to the heart. Plaque can also become weak and break off within a coronary artery and cause a sudden blockage. Other less common causes of CAD include:  A blood clot or a piece of a blood clot or other substance that blocks the flow of blood in a coronary artery (embolism).  A tearing of the artery (spontaneous coronary artery dissection).  An enlargement of an artery (aneurysm).  Inflammation (vasculitis) in the artery wall. What increases the risk? The following factors may make you more likely to develop this condition:  Age. Men over age 45 are at a greater risk of CAD.  Family history of CAD.  Gender. Men often develop CAD earlier in life than women.  High blood pressure (hypertension).  Diabetes.  High cholesterol levels.  Tobacco use.  Excessive alcohol use.  Lack of exercise.  A diet high in saturated and trans fats, such as fried food and processed meat. Other possible risk factors include:  High stress levels.  Depression.  Obesity.  Sleep apnea. What are the signs or symptoms? Many people do not have any symptoms during the early stages of CAD. As the condition progresses, symptoms may include:  Chest pain (angina). The pain can: ? Feel  like crushing or squeezing, or like a tightness, pressure, fullness, or heaviness in the chest. ? Last more than a few minutes or can stop and recur. The pain tends to get worse with exercise or stress and to fade with rest.  Pain in the arms, neck, jaw, ear, or back.  Unexplained heartburn or indigestion.  Shortness of breath.  Nausea or vomiting.  Sudden light-headedness.  Sudden cold sweats.  Fluttering or fast heartbeat (palpitations). How is this diagnosed? This condition is diagnosed based on:  Your family and medical history.  A physical exam.  Tests, including: ? A test to check the electrical signals in your heart (electrocardiogram). ? Exercise stress test. This looks for signs of blockage when the heart is stressed with exercise, such as running on a treadmill. ? Pharmacologic stress test. This test looks for signs of blockage when the heart is being stressed with a medicine. ? Blood tests. ? Coronary angiogram. This is a procedure to look at the coronary arteries to see if there is any blockage. During this test, a dye is injected into your arteries so they appear on an X-ray. ? Coronary artery CT scan. This CT scan helps detect calcium deposits in your coronary arteries. Calcium deposits are an indicator of CAD. ? A test that uses sound waves to take a picture of your heart (echocardiogram). ? Chest X-ray. How is this treated? This condition may be treated by:  Healthy lifestyle changes to reduce risk factors.  Medicines such as: ? Antiplatelet medicines and blood-thinning medicines, such   as aspirin. These help to prevent blood clots. ? Nitroglycerin. ? Blood pressure medicines. ? Cholesterol-lowering medicine.  Coronary angioplasty and stenting. During this procedure, a thin, flexible tube is inserted through a blood vessel and into a blocked artery. A balloon or similar device on the end of the tube is inflated to open up the artery. In some cases, a small,  mesh tube (stent) is inserted into the artery to keep it open.  Coronary artery bypass surgery. During this surgery, veins or arteries from other parts of the body are used to create a bypass around the blockage and allow blood to reach your heart. Follow these instructions at home: Medicines  Take over-the-counter and prescription medicines only as told by your health care provider.  Do not take the following medicines unless your health care provider approves: ? NSAIDs, such as ibuprofen, naproxen, or celecoxib. ? Vitamin supplements that contain vitamin A, vitamin E, or both. Lifestyle  Follow an exercise program approved by your health care provider. Aim for 150 minutes of moderate exercise or 75 minutes of vigorous exercise each week.  Maintain a healthy weight or lose weight as approved by your health care provider.  Learn to manage stress or try to limit your stress. Ask your health care provider for suggestions if you need help.  Get screened for depression and seek treatment, if needed.  Do not use any products that contain nicotine or tobacco, such as cigarettes, e-cigarettes, and chewing tobacco. If you need help quitting, ask your health care provider.  Do not use illegal drugs. Eating and drinking   Follow a heart-healthy diet. A dietitian can help educate you about healthy food options and changes. In general, eat plenty of fruits and vegetables, lean meats, and whole grains.  Avoid foods high in: ? Sugar. ? Salt (sodium). ? Saturated fat, such as processed or fatty meat. ? Trans fat, such as fried foods.  Use healthy cooking methods such as roasting, grilling, broiling, baking, poaching, steaming, or stir-frying.  Do not drink alcohol if your health care provider tells you not to drink.  If you drink alcohol: ? Limit how much you have to 0-2 drinks per day. ? Be aware of how much alcohol is in your drink. In the [place] one drink equals one 12 oz bottle of beer  (355 mL), one 5 oz glass of wine (148 mL), or one 1 oz glass of hard liquor (44 mL). General instructions  Manage any other health conditions, such as hypertension and diabetes. These conditions affect your heart.  Your health care provider may ask you to monitor your blood pressure. Ideally, your blood pressure should be below 130/80.  Keep all follow-up visits as told by your health care provider. This is important. Get help right away if:  You have pain in your chest, neck, ear, arm, jaw, stomach, or back that: ? Lasts more than a few minutes. ? Is recurring. ? Is not relieved by taking medicine under your tongue (sublingual nitroglycerin).  You have profuse sweating without cause.  You have unexplained: ? Heartburn or indigestion. ? Shortness of breath or difficulty breathing. ? Fluttering or fast heartbeat (palpitations). ? Nausea or vomiting. ? Fatigue. ? Feelings of nervousness or anxiety. ? Weakness. ? Diarrhea.  You have sudden light-headedness or dizziness.  You faint.  You feel like hurting yourself or think about taking your own life. These symptoms may represent a serious problem that is an emergency. Do not wait to see if the  symptoms will go away. Get medical help right away. Call your local emergency services (911 in the [place] Do not drive yourself to the hospital. Summary  Coronary artery disease (CAD) is a condition in which the arteries that lead to the heart (coronary arteries) become narrow or blocked. The narrowing or blockage can lead to a heart attack.  Many people do not have any symptoms during the early stages of CAD.  CAD can be treated with lifestyle changes, medicines, surgery, or a combination of these treatments. This information is not intended to replace advice given to you by your health care provider. Make sure you discuss any questions you have with your health care provider. Document Released: 07/26/2013 Document Revised: 09/17/2017  Document Reviewed: 09/07/2017 Elsevier Patient Education  2020 [employer].  Pulmonary Nodule A pulmonary nodule is tissue that has grown on your lung. A nodule may be cancer, but most nodules are not cancer. Follow these instructions at home:   Take over-the-counter and prescription medicines only as told by your doctor.  Do not use any products that have nicotine or tobacco, such as cigarettes and e-cigarettes. If you need help quitting, ask your doctor.  Keep all follow-up visits as told by your doctor. This is important. Contact a doctor if:  You have trouble breathing when doing activities.  You feel sick.  You feel more tired than normal.  You do not feel like eating.  You lose weight without trying.  You have chills.  You have night sweats. Get help right away if:  You cannot catch your breath.  You start making whistling sounds when breathing (wheezing).  You cannot stop coughing.  You cough up blood.  You get dizzy.  You feel like you are going to pass out (faint).  You have sudden chest pain.  You have a fever or symptoms for more than 2-3 days.  You have a fever and your symptoms suddenly get worse. Summary  A pulmonary nodule is tissue that has grown on your lung.  Most nodules are not cancer.  Your doctor will do tests to know what kind of nodule you have, and whether you need treatment for it. This information is not intended to replace advice given to you by your health care provider. Make sure you discuss any questions you have with your health care provider. Document Released: 01/31/2010 Document Revised: 01/22/2017 Document Reviewed: 01/28/2016 Elsevier Patient Education  2020 [employer].

## 2018-09-22 NOTE — Progress Notes (Signed)
Established Patient Office Visit  Subjective:  Patient ID: Jacob Cooper, male    DOB: 1965-05-19  Age: 53 y.o. MRN: [id number]  CC:  Chief Complaint  Patient presents with  . Diabetes    HPI [name] presents for follow-up of Type 2 DM and last Hgb A1c was 8.0 in Jan of 2020 per chart.  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  Patient is self-employed as a Administrator.  Because he is self-employed and without insurance, he tries to limit his visits to doctors.  He feels that overall he is healthy.  He does have history of heart disease and has some occasional, brief left-sided chest pain/pressure but he believes that this is secondary to strenuous activity such as lifting or carrying heavy objects.  He does not believe that this signals any issues with heart disease.  He denies any increased shortness of breath or peripheral edema.  He does tend to have a recurrent cough.  He does continue to smoke and is not sure that he could quit at this time.       He admits that he is not really compliant with monitoring of his blood sugars and does not strictly follow a diabetic diet.  He reports no additional exercise other than related to work or home care activities.  He does have some occasional increased thirst or frequent urination and he knows when this occurs that he needs to improve his diet and make sure that he is taking his medications.  He does believe that his blood pressure has improved since his last visit.  He denies any current issues with headaches or dizziness related to his blood pressure.  He still occasionally forgets to take his blood pressure medications.  Patient does not know why he is on so many medications therefore he usually takes the  ones that he feels that he needs.  Patient reports that he is also starting to get a small, slightly tender bump under his left armpit and he tends to have these more frequently when his sugars are higher.  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  No current leg pain.  He does tend to get some swelling in his right leg if he has been seated for a while.  He is currently on Xarelto and denies any unusual bruising or bleeding at this time.  He has noticed occasional bruising after bumping into objects.  Past Medical History:  Diagnosis Date  . Coronary artery disease   . Diabetes mellitus without complication ([place])   . Hyperlipidemia   . Hypertension   . Myocardial infarction ([place])    12/18 PCI/DES x1 RCA, EF 40-45%    Past Surgical History:  Procedure Laterality Date  . CARDIAC CATHETERIZATION    . CORONARY/GRAFT ACUTE MI REVASCULARIZATION N/A 01/03/2017   Procedure: Coronary/Graft Acute MI Revascularization;  Surgeon: Jettie Booze, MD;  Location: [facility];  Service: Cardiovascular;  Laterality: N/A;  . HERNIA REPAIR    . LEFT HEART CATH AND CORONARY ANGIOGRAPHY N/A 01/03/2017   Procedure: LEFT HEART CATH AND CORONARY ANGIOGRAPHY;  Surgeon: Jettie Booze, MD;  Location: [facility];  Service: Cardiovascular;  Laterality: N/A;  . TONSILLECTOMY      Family History  Problem Relation Age of Onset  . Diabetes Maternal Aunt     Social History   Socioeconomic History  . Marital status: Married    Spouse name: Not on file  . Number of children: Not on file  . Years of education: Not on file  . Highest education level: Not on file  Occupational History  . Not on file  Social Needs  . Financial resource strain: Not very hard  . Food insecurity    Worry: Patient refused    Inability: Patient refused  . Transportation needs    Medical: Patient refused     Non-medical: Patient refused  Tobacco Use  . Smoking status: Current Every Day Smoker    Packs/day: 0.50    Years: 15.00    Pack years: 7.50    Types: Cigarettes  . Smokeless tobacco: Former Neurosurgeon    Types: Chew    Quit date: 1992  Substance and Sexual Activity  . Alcohol use: Yes    Alcohol/week: 3.0 - 4.0 standard drinks    Types: 3 - 4 Shots of liquor per week  . Drug use: No  . Sexual activity: Yes    Partners: Female  Lifestyle  . Physical activity    Days per week: 0 days    Minutes per session: 0 min  . Stress: Rather much  Relationships  . Social connections    Talks on phone: More than three times a week    Gets together: More than three times a week    Attends religious service: Patient refused    Active member of club or organization: Patient refused    Attends meetings of clubs or organizations: Patient refused    Relationship status: Married  . Intimate partner violence    Fear of current or ex partner: Patient refused    Emotionally abused: Patient refused    Physically abused: Patient refused    Forced sexual activity: Patient refused  Other Topics Concern  . Not on file  Social History Narrative  . Not on file    Outpatient Medications Prior to Visit  Medication Sig Dispense Refill  . acyclovir ointment (ZOVIRAX) 5 % Apply 1 application topically every 6 (six) hours as needed. 30 g 0  . amLODipine (NORVASC) 5 MG tablet Take 1 tablet (5 mg total) by mouth daily. 90 tablet 1  . atorvastatin (LIPITOR) 80 MG tablet Take 1 tablet (80 mg total) by mouth daily. 30 tablet 0  . glipiZIDE (GLUCOTROL XL) 10 MG 24 hr tablet Take 1 tablet by mouth once daily with breakfast 30 tablet 0  . losartan (COZAAR) 100 MG tablet Take 1 tablet (100 mg total) by mouth daily. 90 tablet 3  . metFORMIN (GLUCOPHAGE) 500 MG tablet Take 2 tablets (1,000 mg total) by mouth 2 (two) times daily with a meal. 120 tablet 6  . nitroGLYCERIN (NITROSTAT) 0.4 MG SL tablet Place 1 tablet (0.4  mg total) under the tongue every 5 (five) minutes as needed. 25 tablet 3  . rivaroxaban (XARELTO) 20 MG TABS tablet Take 1 tablet (20 mg total) by mouth daily. Needs office visit for more refills. 30 tablet 0  . aspirin 81 MG chewable tablet Chew 1 tablet (81 mg total) by mouth daily. (Patient not taking: Reported on 06/01/2018)    . hydrochlorothiazide (HYDRODIURIL) 25 MG tablet Take 1 tablet (25 mg total) by mouth  daily. (Patient not taking: Reported on 09/22/2018) 90 tablet 3  . metoprolol tartrate (LOPRESSOR) 50 MG tablet Take 1 tablet (50 mg total) by mouth 2 (two) times daily. (Patient not taking: Reported on 09/22/2018) 60 tablet 6   No facility-administered medications prior to visit.     No Known Allergies  ROS Review of Systems  Constitutional: Positive for fatigue (Occasional). Negative for chills, diaphoresis and fever.  HENT: Negative for sore throat and trouble swallowing.   Eyes: Negative for photophobia and visual disturbance.  Respiratory: Positive for cough (Nonproductive). Negative for shortness of breath.   Cardiovascular: Positive for chest pain (Occasional) and leg swelling (Occasional, history of DVT recurrent). Negative for palpitations.  Gastrointestinal: Negative for abdominal pain, blood in stool, constipation, diarrhea and nausea.  Endocrine: Positive for polydipsia and polyuria. Negative for cold intolerance, heat intolerance and polyphagia.  Genitourinary: Positive for frequency. Negative for dysuria.  Musculoskeletal: Negative for arthralgias and back pain.  Neurological: Positive for numbness (Occasional abnormal sensation in his feet). Negative for dizziness and headaches.  Hematological: Negative for adenopathy. Does not bruise/bleed easily.      Objective:    Physical Exam  Constitutional: He is oriented to person, place, and time. He appears well-developed and well-nourished.  Well-nourished well-developed overweight for height/obese older male in no  acute distress.  He is wearing face mask as per office COVID-19 protocol  Neck: Normal range of motion. Neck supple. No JVD present. No thyromegaly present.  Cardiovascular: Normal rate and regular rhythm.  Pulmonary/Chest: Effort normal and breath sounds normal.  Abdominal: Soft. There is no abdominal tenderness. There is no rebound and no guarding.  Musculoskeletal: Normal range of motion.        General: No tenderness or edema.     Comments: No CVA tenderness.  No significant lower extremity edema.  Mild increase in right lower extremity/calf size as compared to the left.  No tenderness to palpation of this area.  Lymphadenopathy:    He has no cervical adenopathy.  Neurological: He is alert and oriented to person, place, and time.  Skin: Skin is warm and dry.  Psychiatric: He has a normal mood and affect. His behavior is normal.  Nursing note and vitals reviewed. Diabetic foot exam-patient with decreased posterior tibial/dorsalis pedis pulses which are poorly palpable.  No evidence of cyanosis and normal capillary refill.  Patient does have calluses on the balls of the feet and heels and decreased sensation in these areas on monofilament exam.  No active skin breakdown on the feet.  Mild thickening of some toenails BP (!) 149/97   Pulse 84   Temp 98.2 F (36.8 C) (Oral)   Ht 5\' 8"  (1.727 m)   Wt 215 lb 12.8 oz (97.9 kg)   SpO2 97%   BMI 32.81 kg/m  Wt Readings from Last 3 Encounters:  09/22/18 215 lb 12.8 oz (97.9 kg)  01/25/18 224 lb 9.6 oz (101.9 kg)  04/16/17 221 lb (100.2 kg)     Health Maintenance Due  Topic Date Due  . FOOT EXAM  11/29/1974  . OPHTHALMOLOGY EXAM  11/29/1974  . HIV Screening  11/29/1979  . TETANUS/TDAP  11/29/1983  . COLONOSCOPY  11/29/2014  . HEMOGLOBIN A1C  07/26/2018  . INFLUENZA VACCINE  08/13/2018    Lab Results  Component Value Date   TSH 2.730 03/18/2017   Lab Results  Component Value Date   WBC 4.5 01/07/2017   HGB 16.1 01/07/2017    HCT 45.2 01/07/2017  MCV 92.6 01/07/2017   PLT 136 (L) 01/07/2017   Lab Results  Component Value Date   NA 143 01/25/2018   K 4.5 01/25/2018   CO2 21 01/25/2018   GLUCOSE 177 (H) 01/25/2018   BUN 13 01/25/2018   CREATININE 0.69 (L) 01/25/2018   BILITOT 0.4 01/25/2018   ALKPHOS 46 01/25/2018   AST 28 01/25/2018   ALT 45 (H) 01/25/2018   PROT 7.0 01/25/2018   ALBUMIN 4.8 01/25/2018   CALCIUM 9.9 01/25/2018   ANIONGAP 10 01/07/2017   Lab Results  Component Value Date   CHOL 91 (L) 01/25/2018   Lab Results  Component Value Date   HDL 31 (L) 01/25/2018   Lab Results  Component Value Date   LDLCALC 12 01/25/2018   Lab Results  Component Value Date   TRIG 241 (H) 01/25/2018   Lab Results  Component Value Date   CHOLHDL 2.9 01/25/2018   Lab Results  Component Value Date   HGBA1C 9.4 (A) 09/22/2018      Assessment & Plan:  1. Type 2 diabetes mellitus with hyperglycemia, without long-term current use of insulin (HCC) Patient with hemoglobin A1c at today's visit of 9.4.  Discussed the importance of controlling blood sugars to help prevent further complications and discussed long-term complications associated with type 2 diabetes.  Patient also with heart disease for which type 2 diabetes increases his risk of having progression of heart disease as well as worsening of peripheral vascular disease.  Patient is encouraged to schedule diabetic eye exam.  Diabetic foot care discussed at today's visit.  Glipizide refilled and more than 15 minutes spent discussing other medication options for control of blood sugars but patient could not decide therefore he will be referred to clinical pharmacist to further discuss his diabetes and possible therapeutic options to help patient get his blood sugars better controlled and closer to goal A1c of 7 or less.  Patient additionally will have comprehensive metabolic panel in follow-up of his diabetes. - POCT glycosylated hemoglobin (Hb A1C) -  Comprehensive metabolic panel - Amb Referral to Clinical Pharmacist - glipiZIDE (GLUCOTROL XL) 10 MG 24 hr tablet; One daily in am before first meal  Dispense: 90 tablet; Refill: 0  2. Coronary artery disease involving native coronary artery of native heart without angina pectoris; 5. status post coronary artery stent placement Patient with CAD status post stent placement and I tried to have a long discussion with the patient regarding the importance of compliance with medications for secondary prevention of heart disease but patient seems to be in denial regarding atherosclerotic heart disease as patient has a belief that he does not have any buildup of atherosclerotic plaque in his arteries and that his coronary artery disease was taken care of by the stent placement he had in the past.  Patient will be referred to cardiology for further evaluation and treatment as he has had no recent follow-up of his heart disease.  He will have them apprehensive metabolic panel in follow-up of his medications.  Patient is encouraged to seek medical attention by calling 911 to obtain EMS evaluation and transport if he has severe chest pain, diaphoresis, uncontrolled nausea vomiting or any other concerns.  Discussed with patient that because he is also diabetic he may not have the typical left-sided chest pain or radiation of pain to the left arm/jaw most often associated with heart disease. - Comprehensive metabolic panel - Ambulatory referral to Cardiology - atorvastatin (LIPITOR) 80 MG tablet; Take 1 tablet (  80 mg total) by mouth daily. In the evenings to lower cholesterol  Dispense: 90 tablet; Refill: 1  3. Essential hypertension Discussed blood pressure goal of 130/80 or less with patient.  He will be referred to clinical pharmacist in follow-up of his hypertension as well as diabetes.  Patient is encouraged to remain compliant with all medications.  New prescriptions provided for his losartan, metoprolol and  amlodipine and discussed why each medication was important and mechanism of action/what was being treated with each medication. - Amb Referral to Clinical Pharmacist - losartan (COZAAR) 100 MG tablet; Take 1 tablet (100 mg total) by mouth daily.  Dispense: 90 tablet; Refill: 1 - metoprolol tartrate (LOPRESSOR) 50 MG tablet; Take 1 tablet (50 mg total) by mouth 2 (two) times daily. To decrease the work of your heart  Dispense: 180 tablet; Refill: 1 - amLODipine (NORVASC) 5 MG tablet; Take 1 tablet (5 mg total) by mouth daily. To lower blood pressure  Dispense: 90 tablet; Refill: 1  4. 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 -CBC with differential - rivaroxaban (XARELTO) 20 MG TABS tablet; Take 1 tablet (20 mg total) by mouth daily.  Dispense: 90 tablet; Refill: 1  5. Status post coronary artery stent placement Patient is being referred back to cardiology in follow-up of his history of CAD status post coronary artery stent placement.  Patient seems to have poor understanding of coronary artery disease/atherosclerosis and claims that he does not have any evidence of coronary atherosclerotic disease but rather states that he was told that a piece of plaque in his arm broke off which traveled to his heart causing his heart attack.  Patient is encouraged to completely stop smoking and to remain compliant with medications for control of hypertension, lipids and diabetes. - Ambulatory referral to Cardiology  6. Long term (current) use of antithrombotics/antiplatelets Importance of compliance with the use of Xarelto secondary to history of recurrent lower extremity DVT.  Will check CBC to look for any anemia or platelet abnormality associated with long-term use of anticoagulant.  Refill provided of Xarelto. - CBC with Differential  7.  Tobacco dependence Referral to clinical pharmacist to help with tobacco dependence.  Patient also with prior CT scan with pulmonary nodule in the left lower lung measuring 5 mm for which he will be referred for chest CT.  Patient also given prescription for albuterol inhaler to use as needed for shortness of breath, cough or wheezing. - Amb Referral to Clinical Pharmacist - albuterol (VENTOLIN HFA) 108 (90 Base) MCG/ACT inhaler; Inhale 2 puffs into the lungs every 6 (six) hours as needed for wheezing or shortness of breath. , cough  Dispense: 18 g; Refill: 3 - CT Chest Wo Contrast; Future  8. Lung nodule < 6cm on CT; 10. Recurrent cough Patient with tobacco dependence and complaint of recent episodes of recurrent cough which resulted in syncopal episodes.  Patient declined referral for pulmonology and on review of patient's chart to see if he had prior radiologic evidence of COPD, I found CT report from 12/31/2016 which mentioned 5 mm nodule in the left lower lung and with patient's history of tobacco dependence as well as symptoms of cough, I discussed with the patient that he needs to have repeat chest CT to look for any changes in the size of the pulmonary nodule which might  be concerning for lung cancer.  Patient also prescribed albuterol inhaler to see if this helps with the cough.  He was also encouraged to follow-up with cardiology regarding syncopal episodes from coughing as he declined pulmonology referral.  He has been referred to the clinical pharmacist for help with tobacco dependence/smoking cessation.  Patient was provided with a copy of his CT scan.  He does report prior immunization for tuberculosis as well as later contracting tuberculosis and wonders if scar tissue from tuberculosis may be causing the abnormality seen on his CT scan. - CT Chest Wo Contrast; Future  9. Furuncle of left axilla Patient may use warm compresses to the area and prescription provided for Augmentin 500 mg 1  twice daily x7 days.  Call or return if area enlarges overcomes or tender - amoxicillin-clavulanate (AUGMENTIN) 500-125 MG tablet; Take 1 tablet (500 mg total) by mouth 2 (two) times daily. Take after eating  Dispense: 14 tablet; Refill: 0  10. Recurrent cough Patient with complaint of recurrent cough and he continues to smoke.  I suspect the patient may have some COPD.  On review of chart, patient actually had abnormal chest CT done on 01/05/2017 which showed a 5 mm left lower lobe pulmonary nodule along with borderline airway thickening.  Patient reports that he was not aware of prior abnormal chest CT showing pulmonary nodule.  He will be scheduled for CT chest without contrast in follow-up of his cough, probable COPD and prior abnormal CT scan with 5 mm left lower lobe pulmonary nodule.  Discussed the importance of smoking cessation at today's visit and patient will also be referred to clinical pharmacist for help with smoking cessation. - CT Chest Wo Contrast; Future  11. Personal history of noncompliance with medical treatment and regimen Discussed with patient the importance of following up with cardiologist regarding his history of coronary artery disease status post MI.  Patient also has history per his report and also on review of medications at today's visit of stopping medications which he does not feel that he needs.  Patient has stopped use of hydrochlorothiazide and metoprolol prior to today's visit and discussed with the patient that I would like for him to restart the use of metoprolol to reduce the work of his heart in light of his prior MI and also because of elevated blood pressure today's visit.  Referral placed for cardiology but patient is not sure if he will keep this follow-up appointment.  Also suggested pulmonology follow-up of his episodes of recurrent coughing which patient declined however patient on review of charts was found to have had prior abnormal CT scan of the chest  which showed a 5 mm lung nodule in December 2018 and patient is being scheduled for repeat chest CT and will be notified of the results and the need for further follow-up based on these results.  An After Visit Summary was printed and given to the patient.  Follow-up: Return for 2 weeks with Franky Macho; 6 months.   Cain Saupe, MD

## 2018-09-23 LAB — COMPREHENSIVE METABOLIC PANEL WITH GFR
ALT: 43 IU/L (ref 0–44)
AST: 27 IU/L (ref 0–40)
Albumin/Globulin Ratio: 2.5 — ABNORMAL HIGH (ref 1.2–2.2)
Albumin: 4.7 g/dL (ref 3.8–4.9)
Alkaline Phosphatase: 47 IU/L (ref 39–117)
BUN/Creatinine Ratio: 26 — ABNORMAL HIGH (ref 9–20)
BUN: 18 mg/dL (ref 6–24)
Bilirubin Total: 0.2 mg/dL (ref 0.0–1.2)
CO2: 28 mmol/L (ref 20–29)
Calcium: 10.1 mg/dL (ref 8.7–10.2)
Chloride: 98 mmol/L (ref 96–106)
Creatinine, Ser: 0.69 mg/dL — ABNORMAL LOW (ref 0.76–1.27)
GFR calc Af Amer: 125 mL/min/1.73
GFR calc non Af Amer: 108 mL/min/1.73
Globulin, Total: 1.9 g/dL (ref 1.5–4.5)
Glucose: 206 mg/dL — ABNORMAL HIGH (ref 65–99)
Potassium: 4.1 mmol/L (ref 3.5–5.2)
Sodium: 142 mmol/L (ref 134–144)
Total Protein: 6.6 g/dL (ref 6.0–8.5)

## 2018-09-23 LAB — CBC WITH DIFFERENTIAL/PLATELET
Basophils Absolute: 0.1 x10E3/uL (ref 0.0–0.2)
Basos: 1 %
EOS (ABSOLUTE): 0.1 x10E3/uL (ref 0.0–0.4)
Eos: 2 %
Hematocrit: 46.7 % (ref 37.5–51.0)
Hemoglobin: 16.5 g/dL (ref 13.0–17.7)
Immature Grans (Abs): 0 x10E3/uL (ref 0.0–0.1)
Immature Granulocytes: 0 %
Lymphocytes Absolute: 2.5 x10E3/uL (ref 0.7–3.1)
Lymphs: 35 %
MCH: 33.4 pg — ABNORMAL HIGH (ref 26.6–33.0)
MCHC: 35.3 g/dL (ref 31.5–35.7)
MCV: 95 fL (ref 79–97)
Monocytes Absolute: 0.7 x10E3/uL (ref 0.1–0.9)
Monocytes: 10 %
Neutrophils Absolute: 3.6 x10E3/uL (ref 1.4–7.0)
Neutrophils: 52 %
Platelets: 207 x10E3/uL (ref 150–450)
RBC: 4.94 x10E6/uL (ref 4.14–5.80)
RDW: 12.6 % (ref 11.6–15.4)
WBC: 7.1 x10E3/uL (ref 3.4–10.8)

## 2018-09-26 ENCOUNTER — Other Ambulatory Visit: Payer: Self-pay | Admitting: Family Medicine

## 2018-09-26 DIAGNOSIS — E1165 Type 2 diabetes mellitus with hyperglycemia: Secondary | ICD-10-CM

## 2018-09-26 DIAGNOSIS — I2510 Atherosclerotic heart disease of native coronary artery without angina pectoris: Secondary | ICD-10-CM

## 2018-09-28 ENCOUNTER — Ambulatory Visit (HOSPITAL_COMMUNITY)
Admission: RE | Admit: 2018-09-28 | Discharge: 2018-09-28 | Disposition: A | Discharge status: Home / Self Care | Payer: BC Managed Care – PPO | Source: Ambulatory Visit | Attending: Family Medicine | Admitting: Family Medicine

## 2018-09-28 ENCOUNTER — Other Ambulatory Visit: Payer: Self-pay

## 2018-09-28 DIAGNOSIS — R911 Solitary pulmonary nodule: Secondary | ICD-10-CM | POA: Diagnosis not present

## 2018-09-28 DIAGNOSIS — R058 Other specified cough: Secondary | ICD-10-CM

## 2018-09-28 DIAGNOSIS — IMO0001 Reserved for inherently not codable concepts without codable children: Secondary | ICD-10-CM

## 2018-09-28 DIAGNOSIS — R05 Cough: Secondary | ICD-10-CM | POA: Diagnosis not present

## 2018-09-28 DIAGNOSIS — F17200 Nicotine dependence, unspecified, uncomplicated: Secondary | ICD-10-CM | POA: Insufficient documentation

## 2018-09-28 DIAGNOSIS — R918 Other nonspecific abnormal finding of lung field: Secondary | ICD-10-CM | POA: Diagnosis not present

## 2018-09-30 ENCOUNTER — Telehealth: Payer: Self-pay | Admitting: Family Medicine

## 2018-09-30 NOTE — Telephone Encounter (Signed)
I called patient with the results and results now marked reviewed so that he can see them on My Chart

## 2018-09-30 NOTE — Telephone Encounter (Signed)
Patient called to check on the status of his imaging results.please follow up.

## 2018-10-03 NOTE — Telephone Encounter (Signed)
noted 

## 2018-10-06 ENCOUNTER — Other Ambulatory Visit: Payer: Self-pay

## 2018-10-06 ENCOUNTER — Ambulatory Visit (HOSPITAL_BASED_OUTPATIENT_CLINIC_OR_DEPARTMENT_OTHER): Payer: BC Managed Care – PPO | Admitting: Pharmacist

## 2018-10-06 DIAGNOSIS — E1165 Type 2 diabetes mellitus with hyperglycemia: Secondary | ICD-10-CM | POA: Diagnosis not present

## 2018-10-06 DIAGNOSIS — F17210 Nicotine dependence, cigarettes, uncomplicated: Secondary | ICD-10-CM | POA: Diagnosis not present

## 2018-10-06 MED ORDER — VICTOZA 18 MG/3ML ~~LOC~~ SOPN: PEN_INJECTOR | SUBCUTANEOUS | 2 refills | Status: DC

## 2018-10-06 MED ORDER — METFORMIN HCL ER 500 MG PO TB24: 1000.0000 mg | ORAL_TABLET | Freq: Two times a day (BID) | ORAL | 2 refills | Status: DC

## 2018-10-06 MED ORDER — TRUEPLUS PEN NEEDLES 32G X 4 MM MISC: 11 refills | Status: DC

## 2018-10-06 NOTE — Progress Notes (Signed)
    S:    PCP: Dr. Chapman Fitch  No chief complaint on file.  Visit was conducted via telemedicine due to the current Covid-19 pandemic. I am in my office. The patient is in his truck. After verification that I was speaking to the patient using two identifies, he agreed to proceed with the visit.   Patient is in good spirits. Presents for diabetes evaluation, education, and management Patient was referred and last seen by Primary Care Provider on 09/22/18.  Family/Social History:  FHx: DM (maternal aunt) - Tobacco: current every day smoker; 10-12 cigarettes/day (interested in Chantix; no abnormal thoughts, nightmares, psychosis) - Alcohol: occasional 3-4 shots of liquor occasionally   Insurance coverage/medication affordability: BCBS  Patient reports adherence with medications. Endorses diarrhea with metformin regular release.  Current diabetes medications include: metformin 500 mg; two tablets BID; glipizide 10 mg XL daily Current hypertension medications include: amlodipine 5 mg daily, HCTZ 25 mg daily, losartan 100 mg daily Current hyperlipidemia medications include: atorvastatin 80 mg daily  Patient denies hypoglycemic events.  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  Patient denies neuropathy. Patient reports visual changes. Patient reports self foot exams.    O:  Lab Results  Component Value Date   HGBA1C 9.4 (A) 09/22/2018   There were no vitals filed for this visit.  Lipid Panel     Component Value Date/Time   CHOL 91 (L) 01/25/2018 1649   TRIG 241 (H) 01/25/2018 1649   HDL 31 (L) 01/25/2018 1649   CHOLHDL 2.9 01/25/2018 1649   CHOLHDL 6.1 01/03/2017 1426   VLDL UNABLE TO CALCULATE IF TRIGLYCERIDE OVER 400 mg/dL 01/03/2017 1426   LDLCALC 12 01/25/2018 1649   Home CBG: 200-220s. 1 outlier in 300s (did not take metformin that day)   Clinical ASCVD: Yes  The ASCVD  Risk score Mikey Bussing DC Jr., et al., 2013) failed to calculate for the following reasons:   The patient has a prior MI or stroke diagnosis   A/P: Diabetes longstanding currently uncontrolled. Patient is not able to verbalize appropriate hypoglycemia management plan. Patient is adherent with medication. Control is suboptimal due to physical inactivity and dietary indiscretion.  Given his cardiac hx, I recommend Victoza given the cardiac benefit seen in studies. Additionally, patients highly represented in the LEADER trial had established ASCVD.   -Switch metformin to 500 mg XR. Take 1000 mg (2 tabs) BID. -Start Victoza 0.6 mg daily x1 week, then 1.2 mg daily x1 week, hen 1.8 mg daily thereafter. -Discontinued glipizide. -Extensively discussed pathophysiology of DM, recommended lifestyle interventions, dietary effects on glycemic control -Counseled on s/sx of and management of hypoglycemia -Next A1C anticipated 12/2018.   ASCVD risk - secondary prevention in patient with DM. Last LDL is controlled.  -Continued aspirin 81 mg  -Continued atorvastatin 80 mg.   Written patient instructions provided.  Total time in face to face counseling 20 minutes.   Follow up Pharmacist Clinic Visit in 1 month.     Patient seen with: Dillard Essex PharmD Candidate  Class of 2022 [place], PharmD, [place] [phone]

## 2018-11-03 ENCOUNTER — Ambulatory Visit: Payer: BC Managed Care – PPO | Admitting: Pharmacist

## 2018-11-04 DIAGNOSIS — M25552 Pain in left hip: Secondary | ICD-10-CM | POA: Diagnosis not present

## 2018-11-04 DIAGNOSIS — S80211A Abrasion, right knee, initial encounter: Secondary | ICD-10-CM | POA: Diagnosis not present

## 2018-11-04 DIAGNOSIS — Z79899 Other long term (current) drug therapy: Secondary | ICD-10-CM | POA: Diagnosis not present

## 2018-11-04 DIAGNOSIS — E119 Type 2 diabetes mellitus without complications: Secondary | ICD-10-CM | POA: Diagnosis not present

## 2018-11-04 DIAGNOSIS — S70212A Abrasion, left hip, initial encounter: Secondary | ICD-10-CM | POA: Diagnosis not present

## 2018-11-04 DIAGNOSIS — Z7902 Long term (current) use of antithrombotics/antiplatelets: Secondary | ICD-10-CM | POA: Diagnosis not present

## 2018-11-04 DIAGNOSIS — Z87891 Personal history of nicotine dependence: Secondary | ICD-10-CM | POA: Diagnosis not present

## 2018-11-04 DIAGNOSIS — M25562 Pain in left knee: Secondary | ICD-10-CM | POA: Diagnosis not present

## 2018-11-04 DIAGNOSIS — Z7984 Long term (current) use of oral hypoglycemic drugs: Secondary | ICD-10-CM | POA: Diagnosis not present

## 2018-11-04 DIAGNOSIS — Z23 Encounter for immunization: Secondary | ICD-10-CM | POA: Diagnosis not present

## 2018-11-04 DIAGNOSIS — I10 Essential (primary) hypertension: Secondary | ICD-10-CM | POA: Diagnosis not present

## 2018-11-04 DIAGNOSIS — K219 Gastro-esophageal reflux disease without esophagitis: Secondary | ICD-10-CM | POA: Diagnosis not present

## 2018-11-04 DIAGNOSIS — W1789XA Other fall from one level to another, initial encounter: Secondary | ICD-10-CM | POA: Diagnosis not present

## 2018-11-07 ENCOUNTER — Other Ambulatory Visit: Payer: Self-pay | Admitting: Pharmacist

## 2018-11-07 ENCOUNTER — Telehealth: Payer: Self-pay | Admitting: Family Medicine

## 2018-11-07 DIAGNOSIS — E1165 Type 2 diabetes mellitus with hyperglycemia: Secondary | ICD-10-CM

## 2018-11-07 MED ORDER — VICTOZA 18 MG/3ML ~~LOC~~ SOPN: 1.8000 mg | PEN_INJECTOR | Freq: Every day | SUBCUTANEOUS | 2 refills | Status: DC

## 2018-11-07 NOTE — Telephone Encounter (Signed)
Pt called stating his  -liraglutide (VICTOZA) 18 MG/3ML SOPN  Is needing prior auth from his insurance company. 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  [place], [place]

## 2018-11-08 ENCOUNTER — Telehealth: Payer: Self-pay | Admitting: Family Medicine

## 2018-11-08 NOTE — Telephone Encounter (Signed)
In process, I will call pt when approved or denied.

## 2018-11-08 NOTE — Telephone Encounter (Signed)
Will forward to technician in [facility] pharmacy handling PA.

## 2018-11-09 ENCOUNTER — Telehealth: Payer: Self-pay

## 2018-11-09 NOTE — Telephone Encounter (Signed)
Luke please what are the options do we have that are preferred?  Thank you

## 2018-11-09 NOTE — Telephone Encounter (Signed)
I ran test claims for multiple brand name products including Victoza, Ozempic, Januvia, Invokana/Farxiga/Jardiance and these were denied stating that his plan does not cover brand products. The will only cover those available in generic. The one exception is insulin products. I ran a test claim for Lantus and found that it was covered.

## 2018-11-09 NOTE — Telephone Encounter (Signed)
Mr. Ballow prior authorization on his Victoza has been denied due to plan exclusion.  This drug is no longer covered.  If appropriate can therapy be changed?

## 2018-11-10 MED ORDER — LANTUS SOLOSTAR 100 UNIT/ML ~~LOC~~ SOPN: 8.0000 [IU] | PEN_INJECTOR | Freq: Every day | SUBCUTANEOUS | 3 refills | Status: DC

## 2018-11-10 NOTE — Telephone Encounter (Signed)
Could you please inform him I have sent a prescription for Lantus to his Pharmacy? Thanks

## 2018-11-10 NOTE — Addendum Note (Signed)
Addended by: Charlott Rakes on: 11/10/2018 05:13 PM   Modules accepted: Orders

## 2018-11-14 NOTE — Telephone Encounter (Signed)
lvm for pt-TRANSFERRED LANTUS TO [facility] IN [place]. LEFT MESSAGE STATING THE PRIOR AUTH FOR [place] WAS DENIED AND CHANGE OF MEDICATION WAS BEING FILLED AT [facility]. IF PT HAD ANY QUESTIONS TO CALL us BACK. KG 11/14/18 5PM

## 2018-11-16 DIAGNOSIS — M25552 Pain in left hip: Secondary | ICD-10-CM | POA: Diagnosis not present

## 2018-11-16 DIAGNOSIS — M238X2 Other internal derangements of left knee: Secondary | ICD-10-CM | POA: Diagnosis not present

## 2018-11-16 DIAGNOSIS — M25562 Pain in left knee: Secondary | ICD-10-CM | POA: Diagnosis not present

## 2018-11-17 ENCOUNTER — Encounter: Payer: Self-pay | Admitting: General Practice

## 2018-11-18 ENCOUNTER — Ambulatory Visit: Payer: BC Managed Care – PPO | Admitting: Pharmacist

## 2018-11-24 ENCOUNTER — Other Ambulatory Visit: Payer: Self-pay | Admitting: Family Medicine

## 2018-11-24 NOTE — Telephone Encounter (Signed)
Patient saw Dr Chapman Fitch and he wants to keep having care  With her and he wants to know if he can  take Trulicity because has less side effects and he use [facility] [address]

## 2018-12-01 NOTE — Telephone Encounter (Signed)
Jacob Cooper, is patient on Trulicity? I did not see this on the medication list attached to today's note but I will look at your last note as well.

## 2018-12-02 ENCOUNTER — Other Ambulatory Visit: Payer: Self-pay | Admitting: Pharmacist

## 2018-12-02 ENCOUNTER — Ambulatory Visit: Payer: BC Managed Care – PPO | Admitting: Pharmacist

## 2018-12-02 MED ORDER — TRULICITY 0.75 MG/0.5ML ~~LOC~~ SOAJ: 0.7500 mg | SUBCUTANEOUS | 0 refills | Status: DC

## 2018-12-02 NOTE — Telephone Encounter (Signed)
Can you contact the patient about his diabetes as he is requesting Trulicity. Maybe he is thinking of the wrong medication?

## 2018-12-02 NOTE — Telephone Encounter (Signed)
The patient tried Victoza but his insurance would not cover it. I do not see where we ever tried him on Trulicity, so I'm not sure how he obtained or was able to give information concerning side effects.  We spoke with him last month, and found that his insurance will not cover branded products with the exception of Lantus insulin. Dr. Margarita Rana sent in for Lantus on 11/09/18.

## 2018-12-02 NOTE — Telephone Encounter (Signed)
Contacted pt and verified I was talking to him using two patient identifiers.   Dr. Margarita Rana wrote for Lantus to replace Victoza as Victoza is not covered by insurance. Pt reports he did read some information concerning the Lantus, hypoglycemia risk, and weight gain. I attempted to provide education concerning these but he stated that he wants to try Trulicity based on information available on drug commercials and the Internet.   I told him that Trulicity is in the same class as Victoza, and I provided education about it's role in improving glycemic control. However, as his insurance does not cover brand products, it is highly unlikely that they will cover Trulicity. I told him I would let his PCP know and we can try to send in a script to see if it is covered.

## 2019-01-25 ENCOUNTER — Other Ambulatory Visit: Payer: Self-pay | Admitting: Family Medicine

## 2019-01-25 DIAGNOSIS — I2510 Atherosclerotic heart disease of native coronary artery without angina pectoris: Secondary | ICD-10-CM

## 2019-01-25 DIAGNOSIS — I82401 Acute embolism and thrombosis of unspecified deep veins of right lower extremity: Secondary | ICD-10-CM

## 2019-01-26 ENCOUNTER — Other Ambulatory Visit: Payer: Self-pay | Admitting: Pharmacist

## 2019-01-26 MED ORDER — ALBUTEROL SULFATE HFA 108 (90 BASE) MCG/ACT IN AERS: 2.0000 | INHALATION_SPRAY | Freq: Four times a day (QID) | RESPIRATORY_TRACT | 2 refills | Status: AC | PRN

## 2019-01-26 MED FILL — XARELTO 20 MG TABLET: 20 | 30 days supply | Qty: 30 | Fill #0

## 2019-03-02 DIAGNOSIS — R531 Weakness: Secondary | ICD-10-CM | POA: Diagnosis not present

## 2019-03-02 DIAGNOSIS — Z86718 Personal history of other venous thrombosis and embolism: Secondary | ICD-10-CM | POA: Diagnosis not present

## 2019-03-02 DIAGNOSIS — J069 Acute upper respiratory infection, unspecified: Secondary | ICD-10-CM | POA: Diagnosis not present

## 2019-03-02 DIAGNOSIS — F17200 Nicotine dependence, unspecified, uncomplicated: Secondary | ICD-10-CM | POA: Diagnosis not present

## 2019-03-02 DIAGNOSIS — R05 Cough: Secondary | ICD-10-CM | POA: Diagnosis not present

## 2019-03-02 DIAGNOSIS — E119 Type 2 diabetes mellitus without complications: Secondary | ICD-10-CM | POA: Diagnosis not present

## 2019-03-02 DIAGNOSIS — R062 Wheezing: Secondary | ICD-10-CM | POA: Diagnosis not present

## 2019-03-02 DIAGNOSIS — Z79899 Other long term (current) drug therapy: Secondary | ICD-10-CM | POA: Diagnosis not present

## 2019-03-02 DIAGNOSIS — Z720 Tobacco use: Secondary | ICD-10-CM | POA: Diagnosis not present

## 2019-03-02 DIAGNOSIS — K219 Gastro-esophageal reflux disease without esophagitis: Secondary | ICD-10-CM | POA: Diagnosis not present

## 2019-03-02 DIAGNOSIS — Z7901 Long term (current) use of anticoagulants: Secondary | ICD-10-CM | POA: Diagnosis not present

## 2019-03-02 DIAGNOSIS — Z7984 Long term (current) use of oral hypoglycemic drugs: Secondary | ICD-10-CM | POA: Diagnosis not present

## 2019-03-02 DIAGNOSIS — I10 Essential (primary) hypertension: Secondary | ICD-10-CM | POA: Diagnosis not present

## 2019-03-03 DIAGNOSIS — R05 Cough: Secondary | ICD-10-CM | POA: Diagnosis not present

## 2019-03-03 DIAGNOSIS — R6883 Chills (without fever): Secondary | ICD-10-CM | POA: Diagnosis not present

## 2019-03-03 DIAGNOSIS — R197 Diarrhea, unspecified: Secondary | ICD-10-CM | POA: Diagnosis not present

## 2019-03-03 DIAGNOSIS — R0602 Shortness of breath: Secondary | ICD-10-CM | POA: Diagnosis not present

## 2019-03-06 ENCOUNTER — Telehealth: Payer: Self-pay

## 2019-03-06 NOTE — Telephone Encounter (Signed)
ERROR

## 2019-03-25 ENCOUNTER — Other Ambulatory Visit: Payer: Self-pay | Admitting: Family Medicine

## 2019-03-25 DIAGNOSIS — E1165 Type 2 diabetes mellitus with hyperglycemia: Secondary | ICD-10-CM

## 2019-03-29 ENCOUNTER — Other Ambulatory Visit: Payer: Self-pay | Admitting: Family Medicine

## 2019-03-29 DIAGNOSIS — E1165 Type 2 diabetes mellitus with hyperglycemia: Secondary | ICD-10-CM

## 2019-04-05 ENCOUNTER — Encounter: Payer: Self-pay | Admitting: Family Medicine

## 2019-04-05 ENCOUNTER — Ambulatory Visit: Payer: BC Managed Care – PPO

## 2019-04-05 ENCOUNTER — Other Ambulatory Visit: Payer: Self-pay

## 2019-04-05 ENCOUNTER — Ambulatory Visit: Payer: BC Managed Care – PPO | Attending: Family Medicine | Admitting: Family Medicine

## 2019-04-05 VITALS — BP 115/77 | HR 85 | Temp 98.8°F | Ht 68.0 in | Wt 203.4 lb

## 2019-04-05 DIAGNOSIS — IMO0001 Reserved for inherently not codable concepts without codable children: Secondary | ICD-10-CM

## 2019-04-05 DIAGNOSIS — Z789 Other specified health status: Secondary | ICD-10-CM

## 2019-04-05 DIAGNOSIS — Z7902 Long term (current) use of antithrombotics/antiplatelets: Secondary | ICD-10-CM

## 2019-04-05 DIAGNOSIS — I2510 Atherosclerotic heart disease of native coronary artery without angina pectoris: Secondary | ICD-10-CM | POA: Diagnosis not present

## 2019-04-05 DIAGNOSIS — R911 Solitary pulmonary nodule: Secondary | ICD-10-CM | POA: Diagnosis not present

## 2019-04-05 DIAGNOSIS — I82401 Acute embolism and thrombosis of unspecified deep veins of right lower extremity: Secondary | ICD-10-CM

## 2019-04-05 DIAGNOSIS — E1165 Type 2 diabetes mellitus with hyperglycemia: Secondary | ICD-10-CM

## 2019-04-05 DIAGNOSIS — Z955 Presence of coronary angioplasty implant and graft: Secondary | ICD-10-CM

## 2019-04-05 DIAGNOSIS — I10 Essential (primary) hypertension: Secondary | ICD-10-CM

## 2019-04-05 DIAGNOSIS — F17200 Nicotine dependence, unspecified, uncomplicated: Secondary | ICD-10-CM

## 2019-04-05 DIAGNOSIS — Z91199 Patient's noncompliance with other medical treatment and regimen due to unspecified reason: Secondary | ICD-10-CM

## 2019-04-05 DIAGNOSIS — Z9119 Patient's noncompliance with other medical treatment and regimen: Secondary | ICD-10-CM

## 2019-04-05 LAB — POCT GLYCOSYLATED HEMOGLOBIN (HGB A1C): Hemoglobin A1C: 12.3 % — AB (ref 4.0–5.6)

## 2019-04-05 LAB — GLUCOSE, POCT (MANUAL RESULT ENTRY): POC Glucose: 470 mg/dL — AB (ref 70–99)

## 2019-04-05 MED ORDER — METFORMIN HCL ER 500 MG PO TB24: 1000.0000 mg | ORAL_TABLET | Freq: Two times a day (BID) | ORAL | 2 refills | Status: DC

## 2019-04-05 MED ORDER — GLIPIZIDE ER 10 MG PO TB24: 10.0000 mg | ORAL_TABLET | Freq: Every day | ORAL | 1 refills | Status: DC

## 2019-04-05 MED ORDER — INSULIN ASPART 100 UNIT/ML ~~LOC~~ SOLN
12.0000 [IU] | Freq: Once | SUBCUTANEOUS | Status: AC
  Administered 2019-04-05: 12 [IU] via SUBCUTANEOUS

## 2019-04-05 MED ORDER — RIVAROXABAN 20 MG PO TABS: 20.0000 mg | ORAL_TABLET | Freq: Every day | ORAL | 0 refills | Status: DC

## 2019-04-05 MED ORDER — VARENICLINE TARTRATE 1 MG PO TABS: 1.0000 mg | ORAL_TABLET | Freq: Two times a day (BID) | ORAL | 0 refills | Status: DC

## 2019-04-05 MED ORDER — LOSARTAN POTASSIUM 100 MG PO TABS: 100.0000 mg | ORAL_TABLET | Freq: Every day | ORAL | 1 refills | Status: DC

## 2019-04-05 MED ORDER — ATORVASTATIN CALCIUM 80 MG PO TABS: 80.0000 mg | ORAL_TABLET | Freq: Every day | ORAL | 1 refills | Status: AC

## 2019-04-05 MED ORDER — METOPROLOL TARTRATE 50 MG PO TABS: 50.0000 mg | ORAL_TABLET | Freq: Two times a day (BID) | ORAL | 1 refills | Status: DC

## 2019-04-05 MED ORDER — CHANTIX STARTING MONTH PAK 0.5 MG X 11 & 1 MG X 42 PO TABS: ORAL_TABLET | ORAL | 0 refills | Status: DC

## 2019-04-05 NOTE — Progress Notes (Signed)
Patient interested in starting Chantix  Patient would like med refills on all of his medications  a1c today is 12.3  cbg is 470

## 2019-04-05 NOTE — Progress Notes (Signed)
Subjective:  Patient ID: Jacob Cooper, male    DOB: 07/11/1965  Age: 54 y.o. MRN: [id number]  CC: No chief complaint on file.   HPI Jacob Cooper, 54 year old male, last seen in the office on 09/22/2018 for follow-up of chronic medical issues who returns at today's visit due to complaint of being out of medication, Metformin, for treatment of type 2 diabetes.  He reports that since his last visit he has made dietary changes and lost approximately 25 to 30 pounds.  He does continue to smoke about 1 to 1-1/2 packs/day of cigarettes and he reports that he would like to be prescribed Chantix in order to stop smoking.  He reports that he was hospitalized at [facility] in February after onset of cough and shortness of breath.  He states that he was told that he now has multiple lung nodules on imaging that was done during his hospitalization.  He reports that he was treated with several antibiotics and steroids before feeling better.  He also reports that he was given an injectable medication for treatment of his diabetes which he took for about 30 days but then when he attempted to refill the medication, he was told that his insurance would not pay for the medicine.  He states that he was told that his insurance would only cover a generic version of the medicine and there was no generic version available.  He cannot recall which medication but thinks that it may have been Trulicity or Victoza.  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    He reports that his battery in his glucometer is not currently working and therefore he has not been able to check his blood sugars recently.  He has had some frequent urination and increased thirst.  He needs all of his medications refilled. No chest pain or palpitations related to his heart disease. No unusual bruising or bleeding related to his chronic use of blood thinning medication. SOB improved since his hospitalization.   Past Medical History:  Diagnosis Date  . Coronary artery disease   . Diabetes mellitus without complication ([place])   . Hyperlipidemia   . Hypertension   . Myocardial infarction ([place])    12/18 PCI/DES x1 RCA, EF 40-45%    Past Surgical History:  Procedure Laterality Date  . CARDIAC CATHETERIZATION    . CORONARY/GRAFT ACUTE MI REVASCULARIZATION N/A 01/03/2017   Procedure: Coronary/Graft Acute MI Revascularization;  Surgeon: Jettie Booze, MD;  Location: [facility];  Service: Cardiovascular;  Laterality: N/A;  . HERNIA REPAIR    . LEFT HEART CATH AND CORONARY ANGIOGRAPHY N/A 01/03/2017   Procedure: LEFT HEART CATH AND CORONARY ANGIOGRAPHY;  Surgeon: Jettie Booze, MD;  Location: [facility];  Service: Cardiovascular;  Laterality: N/A;  . TONSILLECTOMY      Family History  Problem Relation Age of Onset  . Diabetes Maternal Aunt     Social  History   Tobacco Use  . Smoking status: Current Every Day Smoker    Packs/day: 0.50    Years: 15.00    Pack years: 7.50    Types: Cigarettes  . Smokeless tobacco: Former Systems developer    Types: Chew    Quit date: 1992  Substance Use Topics  . Alcohol use: Yes    Alcohol/week: 3.0 - 4.0 standard drinks    Types: 3 - 4 Shots of liquor per week    ROS Review of Systems  Constitutional: Positive for fatigue. Negative for chills and fever.  HENT: Negative for sore throat and trouble swallowing.   Eyes: Negative for photophobia and visual disturbance.  Respiratory: Positive for shortness of  breath (improved). Negative for cough.   Cardiovascular: Negative for chest pain and palpitations.  Gastrointestinal: Negative for abdominal pain, constipation, diarrhea and nausea.  Endocrine: Positive for polydipsia and polyuria.  Genitourinary: Positive for frequency. Negative for dysuria.  Musculoskeletal: Negative for arthralgias and back pain.  Skin: Negative for rash and wound.  Neurological: Negative for dizziness and headaches.  Hematological: Negative for adenopathy. Does not bruise/bleed easily.    Objective:   Today's Vitals: BP 115/77 (BP Location: Left Arm, Patient Position: Sitting, Cuff Size: Large)   Pulse 85   Temp 98.8 F (37.1 C) (Oral)   Ht 5' 8"  (1.727 m)   Wt 203 lb 6.4 oz (92.3 kg)   SpO2 96%   BMI 30.93 kg/m   Physical Exam Vitals and nursing note reviewed.  Constitutional:      General: He is not in acute distress.    Appearance: Normal appearance.     Comments: Overweight for height older male in NAD wearing mask as per office COVID-19 protocol  Neck:     Vascular: No carotid bruit.  Cardiovascular:     Rate and Rhythm: Normal rate and regular rhythm.     Pulses:          Dorsalis pedis pulses are 1+ on the right side and 1+ on the left side.       Posterior tibial pulses are 1+ on the right side and 1+ on the left side.  Pulmonary:     Effort: Pulmonary effort is normal.     Breath sounds: Normal breath sounds.  Abdominal:     Palpations: Abdomen is soft.     Tenderness: There is no right CVA tenderness, left CVA tenderness, guarding or rebound.  Musculoskeletal:        General: No tenderness.     Cervical back: Normal range of motion and neck supple. No tenderness.     Right lower leg: No edema.     Left lower leg: No edema.     Comments: Bilateral mild LE edema with varicose veins left greater than right  Feet:     Right foot:     Skin integrity: Skin integrity normal.     Toenail Condition: Right toenails are normal.     Left foot:      Skin integrity: Skin integrity normal.     Toenail Condition: Left toenails are normal.  Lymphadenopathy:     Cervical: No cervical adenopathy.  Skin:    General: Skin is warm and dry.     Comments: No active skin breakdown on the feet  Neurological:     General: No focal deficit present.     Mental Status: He is alert and oriented to person, place, and time.  Psychiatric:  Mood and Affect: Mood normal.        Behavior: Behavior normal.     Assessment & Plan:  1. Type 2 diabetes mellitus with hyperglycemia, without long-term current use of insulin ([place]) He reports that he has been out of medication for the treatment of his diabetes.  Blood sugar today's visit was elevated at 470 and he was given 12 units of NovoLog x1 here in the office.  Patient left before having blood sugar rechecked.  Hemoglobin A1c was elevated at 12.3.  Patient left prior to having comprehensive metabolic panel which was changed to a future lab.  Refills had been provided for glipizide XL 10 mg daily and refill of Metformin to take at 1000 mg twice daily.  Patient reported that he was placed on medication during his hospitalization at [facility] in [place], [place], which was a once daily injectable that he felt controlled his blood sugars.  I was unable to see his records from his hospitalization after using Care Everywhere.  Per CMA, she was able to obtain his records through care everywhere from his hospitalization and she was asked to print a copy of his discharge summary as well as obtain medical record release for records from his hospitalization at today's visit. (Unfortunately I have not yet received the requested records from [facility]).  Patient left the office while I was reviewing his chart.  It appears that he was on Trulicity which appears to have last been dispensed in November 2020 and at [facility] as well as on either Basaglar 8 units from [place] or Lantus which was dispensed on 11/09/2018 at  [facility].  Patient with history of noncompliance with recommended medical therapy.  He will be referred to endocrinology for ongoing treatment of his poorly controlled type 2 diabetes - HgB A1c - Glucose (CBG) - insulin aspart (novoLOG) injection 12 Units - Comprehensive metabolic panel; Future - metFORMIN (GLUCOPHAGE-XR) 500 MG 24 hr tablet; Take 2 tablets (1,000 mg total) by mouth 2 (two) times daily.  Dispense: 120 tablet; Refill: 2 - glipiZIDE (GLUCOTROL XL) 10 MG 24 hr tablet; Take 1 tablet (10 mg total) by mouth daily with breakfast.  Dispense: 90 tablet; Refill: 1 - Ambulatory referral to Endocrinology  2. Coronary artery disease involving native coronary artery of native heart without angina pectoris; 6. Status post coronary artery stenting He denies any current issues with chest pain.  He continues to have uncontrolled type 2 diabetes which can increase his risk of future cardiac events.  He is to continue follow-up with cardiology.  He was provided with refill of his Lopressor and atorvastatin for secondary prevention.  Patient left prior to having electrolytes and liver enzymes checked through comprehensive metabolic panel therefore this will be moved to the future lab if patient returns to the office. - Comprehensive metabolic panel; Future - metoprolol tartrate (LOPRESSOR) 50 MG tablet; Take 1 tablet (50 mg total) by mouth 2 (two) times daily. To decrease the work of your heart  Dispense: 180 tablet; Refill: 1 - atorvastatin (LIPITOR) 80 MG tablet; Take 1 tablet (80 mg total) by mouth daily.  Dispense: 90 tablet; Refill: 1  3. Essential hypertension BP controlled on current medications of metoprolol and losartan which he will continue and refills provided.  - metoprolol tartrate (LOPRESSOR) 50 MG tablet; Take 1 tablet (50 mg total) by mouth 2 (two) times daily. To decrease the work of your heart  Dispense: 180 tablet; Refill: 1 - losartan (COZAAR) 100  MG  tablet; Take 1 tablet (100 mg total) by mouth daily.  Dispense: 90 tablet; Refill: 1  4. 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 - CBC-patient left prior to getting his blood work done - rivaroxaban (XARELTO) 20 MG TABS tablet; Take 1 tablet (20 mg total) by mouth daily. Please make a PCP appointment.  Dispense: 30 tablet; Refill: 0  5. Tobacco dependence; lung nodules on CT scan Prescriptions provided for Chantix starter pack and 1 month of continuing medication.  He reports hospitalization in February of this year at [facility] in [place] and states that at that time he was told that he now has multiple lung nodules on imaging and he would like to be prescribed Chantix in order to help him stop smoking. -patient left office before medication discussion could be done.  - varenicline (CHANTIX STARTING MONTH PAK) 0.5 MG X 11 & 1 MG X 42 tablet; Take one 0.5 mg tablet by mouth once daily for 3 days, then increase to one 0.5 mg tablet twice daily for 4 days, then increase to one 1 mg tablet twice daily.  Dispense: 53 tablet; Refill: 0 - varenicline (CHANTIX CONTINUING MONTH PAK) 1 MG tablet; Take 1 tablet (1 mg total) by mouth 2 (two) times daily.  Dispense: 60 tablet; Refill: 0  7. Long term (current) use of antithrombotics/antiplatelets CBC in follow-up of long term use of Xarelto due to a history of recurrent DVT's. Xarelto refilled. Patient left before getting his blood work done - CBC  8. Medically complex patient 9.  History of noncompliance with medical treatment and regimen Patient with multiple medical issues including uncontrolled type 2 diabetes with hemoglobin A1c of 12.3 at today's visit, blood sugar elevated at 470.  Patient has met several times with the clinical pharmacist here as he can never reach a decision as to whether or not he would  like to start additional medications for treatment of diabetes.Care Everywhere was reviewed but I could not find any recent hospitalization records and CMA was asked to have patient sign medical record release regarding his treatment at [facility] in [place]. He was apparently on Basaglar 8 units daily after hospitalization on review of outside medications.  -Ambulatory referral to Endocrinology  Outpatient Encounter Medications as of 04/05/2019  Medication Sig  . acyclovir ointment (ZOVIRAX) 5 % Apply 1 application topically every 6 (six) hours as needed.  Marland Kitchen albuterol (PROAIR HFA) 108 (90 Base) MCG/ACT inhaler Inhale 2 puffs into the lungs every 6 (six) hours as needed for wheezing or shortness of breath.  Marland Kitchen amLODipine (NORVASC) 5 MG tablet Take 1 tablet (5 mg total) by mouth daily. To lower blood pressure  . atorvastatin (LIPITOR) 80 MG tablet Take 1 tablet (80 mg total) by mouth daily. Must have office visit for refills  . glipiZIDE (GLUCOTROL XL) 10 MG 24 hr tablet Take 10 mg by mouth every morning.  Marland Kitchen losartan (COZAAR) 100 MG tablet Take 1 tablet (100 mg total) by mouth daily.  . metFORMIN (GLUCOPHAGE-XR) 500 MG 24 hr tablet Take 2 tablets (1,000 mg total) by mouth 2 (two) times daily.  . metoprolol tartrate (LOPRESSOR) 50 MG tablet Take 1 tablet (50 mg total) by mouth 2 (two) times daily. To decrease the work of your heart  . nitroGLYCERIN (NITROSTAT) 0.4 MG SL tablet Place 1 tablet (0.4 mg total) under  the tongue every 5 (five) minutes as needed.  . rivaroxaban (XARELTO) 20 MG TABS tablet Take 1 tablet (20 mg total) by mouth daily. Please make a PCP appointment.  Marland Kitchen amoxicillin-clavulanate (AUGMENTIN) 500-125 MG tablet Take 1 tablet (500 mg total) by mouth 2 (two) times daily. Take after eating (Patient not taking: Reported on 04/05/2019)  . aspirin 81 MG chewable tablet Chew 1 tablet (81 mg total) by mouth daily. (Patient not taking: Reported on 06/01/2018)  . Dulaglutide (TRULICITY)  1.25 GM/7.1XB SOPN Inject 0.75 mg into the skin once a week. (Patient not taking: Reported on 04/05/2019)  . hydrochlorothiazide (HYDRODIURIL) 25 MG tablet Take 1 tablet (25 mg total) by mouth daily. (Patient not taking: Reported on 09/22/2018)  . Insulin Glargine (LANTUS SOLOSTAR) 100 UNIT/ML Solostar Pen Inject 8 Units into the skin daily. (Patient not taking: Reported on 04/05/2019)  . Insulin Pen Needle (TRUEPLUS PEN NEEDLES) 32G X 4 MM MISC Use to inject Victoza every day. (Patient not taking: Reported on 04/05/2019)   Facility-Administered Encounter Medications as of 04/05/2019  Medication  . insulin aspart (novoLOG) injection 12 Units    An After Visit Summary was printed and given to the patient.   Follow-up: Return in about 5 weeks (around 05/10/2019) for DM/chronic issues in 4-5 weeks.   Antony Blackbird MD

## 2019-05-09 DIAGNOSIS — Z23 Encounter for immunization: Secondary | ICD-10-CM | POA: Diagnosis not present

## 2019-05-15 ENCOUNTER — Other Ambulatory Visit: Payer: Self-pay | Admitting: Family Medicine

## 2019-05-15 DIAGNOSIS — I82401 Acute embolism and thrombosis of unspecified deep veins of right lower extremity: Secondary | ICD-10-CM

## 2019-05-31 DIAGNOSIS — E119 Type 2 diabetes mellitus without complications: Secondary | ICD-10-CM | POA: Diagnosis not present

## 2019-05-31 DIAGNOSIS — J209 Acute bronchitis, unspecified: Secondary | ICD-10-CM | POA: Diagnosis not present

## 2019-05-31 DIAGNOSIS — Z86718 Personal history of other venous thrombosis and embolism: Secondary | ICD-10-CM | POA: Diagnosis not present

## 2019-05-31 DIAGNOSIS — I10 Essential (primary) hypertension: Secondary | ICD-10-CM | POA: Diagnosis not present

## 2019-07-13 ENCOUNTER — Other Ambulatory Visit: Payer: Self-pay | Admitting: Family Medicine

## 2019-07-13 DIAGNOSIS — E1165 Type 2 diabetes mellitus with hyperglycemia: Secondary | ICD-10-CM

## 2019-07-30 ENCOUNTER — Other Ambulatory Visit: Payer: Self-pay | Admitting: Family Medicine

## 2019-07-30 DIAGNOSIS — E1165 Type 2 diabetes mellitus with hyperglycemia: Secondary | ICD-10-CM

## 2019-08-12 ENCOUNTER — Other Ambulatory Visit: Payer: Self-pay | Admitting: Family Medicine

## 2019-08-12 DIAGNOSIS — E1165 Type 2 diabetes mellitus with hyperglycemia: Secondary | ICD-10-CM

## 2019-08-12 NOTE — Telephone Encounter (Signed)
Requested medication (s) are due for refill today: yes  Requested medication (s) are on the active medication list: yes  Last refill:  07/13/19  Future visit scheduled: no  Notes to clinic:  overdue lab work   Requested Prescriptions  Pending Prescriptions Disp Refills   metFORMIN (GLUCOPHAGE-XR) 500 MG 24 hr tablet [Pharmacy Med Name: metFORMIN HCl ER 500 MG Oral Tablet Extended Release 24 Hour] 120 tablet 0    Sig: Take 2 tablets by mouth twice daily      Endocrinology:  Diabetes - Biguanides Failed - 08/12/2019 12:43 PM      Failed - Cr in normal range and within 360 days    Creatinine, Ser  Date Value Ref Range Status  09/22/2018 0.69 (L) 0.76 - 1.27 mg/dL Final          Failed - HBA1C is between 0 and 7.9 and within 180 days    Hemoglobin A1C  Date Value Ref Range Status  04/05/2019 12.3 (A) 4.0 - 5.6 % Final   HbA1c, POC (controlled diabetic range)  Date Value Ref Range Status  09/22/2018 9.4 (A) 0.0 - 7.0 % Final          Passed - eGFR in normal range and within 360 days    GFR calc Af Amer  Date Value Ref Range Status  09/22/2018 125 >59 mL/min/1.73 Final   GFR calc non Af Amer  Date Value Ref Range Status  09/22/2018 108 >59 mL/min/1.73 Final          Passed - Valid encounter within last 6 months    Recent Outpatient Visits           4 months ago Type 2 diabetes mellitus with hyperglycemia, without long-term current use of insulin ([place])   [place] Fulp, [place], MD   10 months ago Type 2 diabetes mellitus with hyperglycemia, without long-term current use of insulin ([place])   [place], Annie Main L, RPH-CPP   10 months ago Type 2 diabetes mellitus with hyperglycemia, without long-term current use of insulin ([place])   [place] Fulp, Craig, MD   1 year ago Essential hypertension   [place], [place], [place]   1  year ago Type 2 diabetes mellitus with hyperglycemia, without long-term current use of insulin [facility])   [place] [facility] Charlott Rakes, MD

## 2019-09-07 ENCOUNTER — Other Ambulatory Visit: Payer: Self-pay | Admitting: Family Medicine

## 2019-09-07 DIAGNOSIS — E1165 Type 2 diabetes mellitus with hyperglycemia: Secondary | ICD-10-CM

## 2019-09-07 DIAGNOSIS — I82401 Acute embolism and thrombosis of unspecified deep veins of right lower extremity: Secondary | ICD-10-CM

## 2019-10-16 ENCOUNTER — Encounter: Payer: Self-pay | Admitting: *Deleted

## 2019-10-16 NOTE — Progress Notes (Signed)
Cardiology Office Note  Date: 10/17/2019   ID: Jacob Cooper, DOB Jul 25, 1965, MRN [id number]  PCP:  Hoy Register, MD  Cardiologist:  No primary care provider on file. Electrophysiologist:  None   Chief Complaint: Recurrent DVT and Hx of MI Referred by [facility]  History of Present Illness: Jacob Cooper is a 54 y.o. male with a history of CAD (MI 12/2016 PCI/DES x1 to RCA, EF 40 to 45%), DM 2 uncontrolled, HLD, HTN, MI, tobacco dependence, recurrent DVT, lung nodules, Bilateral LE edema with varicosities. History of non compliance with medical treatment.  Patient is here for referral from primary care provider secondary to history of recurrent DVT and coronary artery disease.  He is a [employer].  History of long-term smoking and tobacco dependence with uncontrolled diabetes.  States his sugars have been running in the 300 range.  Blood pressure is elevated on arrival today.  He had only taken his antihypertensive medications approximately 30 minutes before arrival.  Also stated he had smoked 2 cigarettes prior to arrival which may be accounting for increased blood pressure as well as he has an abscessed tooth causing him pain.  He denies any significant anginal or exertional symptoms other than some mild dyspnea on exertion.  Recent imaging showed lung nodules.  History of noncompliance with medical treatment.  Last echocardiogram after MI in 2018 showed EF of 40 to 45%.  He has bilateral venous varicosities which are causing him some discomfort/venous claudication-like symptoms.  States he would like to be referred for treatment.    Past Medical History:  Diagnosis Date  . Coronary artery disease   . Diabetes mellitus without complication (HCC)   . DVT (deep venous thrombosis) (HCC)   . Hyperlipidemia   . Hypertension   . Myocardial infarction (HCC)    12/18 PCI/DES x1 RCA, EF 40-45%    Past Surgical History:  Procedure Laterality Date  . CARDIAC  CATHETERIZATION    . CORONARY/GRAFT ACUTE MI REVASCULARIZATION N/A 01/03/2017   Procedure: Coronary/Graft Acute MI Revascularization;  Surgeon: Corky Crafts, MD;  Location: [facility] INVASIVE CV LAB;  Service: Cardiovascular;  Laterality: N/A;  . HERNIA REPAIR    . LEFT HEART CATH AND CORONARY ANGIOGRAPHY N/A 01/03/2017   Procedure: LEFT HEART CATH AND CORONARY ANGIOGRAPHY;  Surgeon: Corky Crafts, MD;  Location: [facility] INVASIVE CV LAB;  Service: Cardiovascular;  Laterality: N/A;  . TONSILLECTOMY      Current Outpatient Medications  Medication Sig Dispense Refill  . albuterol (PROAIR HFA) 108 (90 Base) MCG/ACT inhaler Inhale 2 puffs into the lungs every 6 (six) hours as needed for wheezing or shortness of breath. 8.5 g 2  . atorvastatin (LIPITOR) 80 MG tablet Take 1 tablet (80 mg total) by mouth daily. 90 tablet 1  . empagliflozin (JARDIANCE) 25 MG TABS tablet Take 25 mg by mouth daily.    Marland Kitchen glipiZIDE (GLUCOTROL XL) 10 MG 24 hr tablet Take 1 tablet (10 mg total) by mouth daily with breakfast. 90 tablet 1  . losartan-hydrochlorothiazide (HYZAAR) 50-12.5 MG tablet Take 1 tablet by mouth daily.    . metoprolol tartrate (LOPRESSOR) 50 MG tablet Take 1 tablet (50 mg total) by mouth 2 (two) times daily. To decrease the work of your heart 180 tablet 1  . nitroGLYCERIN (NITROSTAT) 0.4 MG SL tablet Place 1 tablet (0.4 mg total) under the tongue every 5 (five) minutes as needed. 25 tablet 3  . XARELTO 20 MG TABS tablet Take 1  tablet by mouth once daily 90 tablet 0   No current facility-administered medications for this visit.   Allergies:  Patient has no known allergies.   Social History: The patient  reports that he has been smoking cigarettes. He has a 7.50 pack-year smoking history. He quit smokeless tobacco use about 29 years ago.  His smokeless tobacco use included chew. He reports current alcohol use of about 3.0 - 4.0 standard drinks of alcohol per week. He reports that he does not use  drugs.   Family History: The patient's family history includes Diabetes in his maternal aunt.   ROS:  Please see the history of present illness. Otherwise, complete review of systems is positive for none.  All other systems are reviewed and negative.   Physical Exam: VS:  BP (!) 142/98   Pulse 75   Ht 5\' 8"  (1.727 m)   Wt 212 lb 9.6 oz (96.4 kg)   SpO2 93%   BMI 32.33 kg/m , BMI Body mass index is 32.33 kg/m.  Wt Readings from Last 3 Encounters:  10/17/19 212 lb 9.6 oz (96.4 kg)  04/05/19 203 lb 6.4 oz (92.3 kg)  09/22/18 215 lb 12.8 oz (97.9 kg)    General: Patient appears comfortable at rest. Neck: Supple, no elevated JVP or carotid bruits, no thyromegaly. Lungs: Clear to auscultation, nonlabored breathing at rest. Cardiac: Regular rate and rhythm, no S3 or significant systolic murmur, no pericardial rub. Extremities: No pitting edema, distal pulses 2+. Skin: Warm and dry. Musculoskeletal: No kyphosis. Neuropsychiatric: Alert and oriented x3, affect grossly appropriate.  ECG:  An ECG dated 10/17/2019 was personally reviewed today and demonstrated:  Normal sinus rhythm rate of 71, possible inferior infarct, age undetermined.  Recent Labwork: No results found for requested labs within last 8760 hours.     Component Value Date/Time   CHOL 91 (L) 01/25/2018 1649   TRIG 241 (H) 01/25/2018 1649   HDL 31 (L) 01/25/2018 1649   CHOLHDL 2.9 01/25/2018 1649   CHOLHDL 6.1 01/03/2017 1426   VLDL UNABLE TO CALCULATE IF TRIGLYCERIDE OVER 400 mg/dL 09/81/191412/23/2018 [id number]   LDLCALC 12 01/25/2018 1649    Other Studies Reviewed Today:  Exercise tolerance test/treadmill stress test 05/05/2017 Study Highlights   Blood pressure demonstrated a normal response to exerc ise.  There was no ST segment deviation noted during stress.  ETT with fair exercise tolerance (7:00); no chest pain; normal BP response; no ST changes; negative adequate ETT; Duke treadmill score 7.  Holter monitor  04/13/2017 Study Highlights  Normal sinus rhythm with rare PACs and rare PVCs.  Continue medical therapy.  Results discussed with patient at his appointment on 04/16/17.    Echocardiogram 05/05/2017 Study Conclusions   - Left ventricle: The cavity size was normal. Wall thickness was  normal. Systolic function was normal. The estimated ejection  fraction was in the range of 50% to 55%. Doppler parameters are  consistent with abnormal left ventricular relaxation (grade 1  diastolic dysfunction).    01/03/2017 Coronary/Graft Acute MI Revascularization  LEFT HEART CATH AND CORONARY ANGIOGRAPHY  Conclusion    Mid RCA lesion is 99% stenosed. Severely tortuous vessel.  A drug-eluting stent was successfully placed using a STENT SYNERGY DES 3X16.  Post intervention, there is a 0% residual stenosis.  Mid RCA to Dist RCA lesion is 90% stenosed. Likely thrombus embolized from the initial lesion.  Balloon angioplasty was performed using a BALLOON EMERGE MR 3.0X12. Due to 360 degree loop, it was very unlikely  a stent would traverse through there.  Post intervention, there is a 10% residual stenosis.  There is moderate left ventricular systolic dysfunction.  The left ventricular ejection fraction is 35-45% by visual estimate.  LV end diastolic pressure is mildly elevated.  There is no aortic valve stenosis.  Diagnostic Dominance: Right  Intervention     CT abdomen and Pelvis 10/03/2019 IMPRESSION:  No acute findings within the abdomen or pelvis. No evidence of  inguinal or abdominal wall hernia.  Colonic diverticulosis, without radiographic evidence of  diverticulitis.  Stable moderate hepatic steatosis.  Aortic Atherosclerosis (ICD10-I70.0).   Assessment and Plan:  1. Recurrent acute deep vein thrombosis (DVT) of lower extremity, unspecified laterality (HCC)   2. CAD in native artery   3. Mixed hyperlipidemia   4. Essential hypertension   5. Tobacco dependence    6. Type 2 diabetes mellitus with hyperglycemia, without long-term current use of insulin (HCC)   7. Cardiomyopathy, unspecified type (HCC)   8. Claudication in peripheral vascular disease (HCC)   9. Claudication (HCC)   10. Venous disease    1. Recurrent acute deep vein thrombosis (DVT) of lower extremity, unspecified laterality (HCC) History of recurrent DVT on long-term Xarelto therapy.  2. CAD in native artery Previous history of MI secondary to plaque rupture in 2018 with stent placement to the RCA. Balloon angioplasty of the mid RCA to distal RCA lesion 90% stenosis likely thrombus embolized from initial lesion.  Balloon angioplasty performed.  Post intervention there was 10% stenosis.  EF on LV gram was 35 to 45% visually.  Denies any significant anginal symptoms.  Denies any chest pain, pressure, tightness.  Continue nitroglycerin sublingual as needed.  3. Mixed hyperlipidemia Continue current high intensity lipid therapy with atorvastatin 80 mg daily.  Last lipid panel on 01/25/2018: TC 91, TG 241, HDL 31, LDL 12  4. Essential hypertension History of hypertension.  Blood pressure today is 142/98.  States he only took his antihypertensive medication about 30 minutes prior to clinic visit.  Also stated he smoked 2 cigarettes prior to arrival.  Continue metoprolol 50 mg p.o. twice daily.  Continue losartan HCTZ 50/12.5 mg daily.  5. Tobacco dependence Long history of tobacco dependence.  Highly encouraged cessation due to multiple risk factors in occluding pre-existing heart disease and diabetes as well as hyperlipidemia.  6. Type 2 diabetes mellitus with hyperglycemia, without long-term current use of insulin (HCC) Type 2 diabetes not well controlled on current therapy.  States he has been getting blood sugars in the 300 range.  States he is not sure what to do about it.  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  Suggested possible dietary changes and possible consult with nutritionist by primary care provider  7. Cardiomyopathy, unspecified type (HCC) Last echocardiogram showed EF of 50 to 55%.  Repeat echocardiogram to reassess LV function, diastolic function, and valvular function.  Secondary to  8. Venous disease with venous claudication/possible arterial claudication. Venous varicosities complaining of discomfort in lower extremities secondary to venous claudication.  Denies any description of arterial claudication which could certainly be present due to his history of coronary artery disease and multiple risk factors.  Refer to vein and vascular for venous claudication and possible arterial claudication symptoms.  9.  Recurrent DVT  Continue Xarelto 20 mg p.o. daily.  Medication Adjustments/Labs and  Tests Ordered: Current medicines are reviewed at length with the patient today.  Concerns regarding medicines are outlined above.   Disposition: Follow-up with Dr. Wyline Mood or APP 6 months.  Signed, Rennis Harding, NP 10/17/2019 1:11 PM    [facility] at [facility]  [address] [place], [place], [place] [address] Phone: [phone]; Fax: [phone]

## 2019-10-17 ENCOUNTER — Encounter: Payer: Self-pay | Admitting: *Deleted

## 2019-10-17 ENCOUNTER — Ambulatory Visit (INDEPENDENT_AMBULATORY_CARE_PROVIDER_SITE_OTHER): Payer: 59 | Admitting: Family Medicine

## 2019-10-17 ENCOUNTER — Encounter: Payer: Self-pay | Admitting: Family Medicine

## 2019-10-17 VITALS — BP 142/98 | HR 75 | Ht 68.0 in | Wt 212.6 lb

## 2019-10-17 DIAGNOSIS — I82409 Acute embolism and thrombosis of unspecified deep veins of unspecified lower extremity: Secondary | ICD-10-CM | POA: Diagnosis not present

## 2019-10-17 DIAGNOSIS — I2510 Atherosclerotic heart disease of native coronary artery without angina pectoris: Secondary | ICD-10-CM | POA: Diagnosis not present

## 2019-10-17 DIAGNOSIS — I10 Essential (primary) hypertension: Secondary | ICD-10-CM

## 2019-10-17 DIAGNOSIS — I879 Disorder of vein, unspecified: Secondary | ICD-10-CM

## 2019-10-17 DIAGNOSIS — I429 Cardiomyopathy, unspecified: Secondary | ICD-10-CM

## 2019-10-17 DIAGNOSIS — F17200 Nicotine dependence, unspecified, uncomplicated: Secondary | ICD-10-CM

## 2019-10-17 DIAGNOSIS — I739 Peripheral vascular disease, unspecified: Secondary | ICD-10-CM

## 2019-10-17 DIAGNOSIS — E782 Mixed hyperlipidemia: Secondary | ICD-10-CM | POA: Diagnosis not present

## 2019-10-17 DIAGNOSIS — E1165 Type 2 diabetes mellitus with hyperglycemia: Secondary | ICD-10-CM

## 2019-10-17 NOTE — Patient Instructions (Signed)
Medication Instructions:  Continue all current medications.  Labwork: none  Testing/Procedures:  Your physician has requested that you have an echocardiogram. Echocardiography is a painless test that uses sound waves to create images of your heart. It provides your doctor with information about the size and shape of your heart and how well your heart's chambers and valves are working. This procedure takes approximately one hour. There are no restrictions for this procedure.  Office will contact with results via phone or letter.    Follow-Up: 6 months   Any Other Special Instructions Will Be Listed Below (If Applicable). You have been referred to:  Vein & Vascular   If you need a refill on your cardiac medications before your next appointment, please call your pharmacy.

## 2019-10-19 ENCOUNTER — Telehealth: Payer: Self-pay | Admitting: Family Medicine

## 2019-10-19 NOTE — Telephone Encounter (Signed)
   [facility] Pre-operative Risk Assessment    HEARTCARE STAFF: - Please ensure there is not already an duplicate clearance open for this procedure. - Under Visit Info/Reason for Call, type in Other and utilize the format Clearance MM/DD/YY or Clearance TBD. Do not use dashes or single digits. - If request is for dental extraction, please clarify the # of teeth to be extracted.  Request for surgical clearance:  1. What type of surgery is being performed? 1. Colonoscopy  2. When is this surgery scheduled?  1. 11.5.2021   3. What type of clearance is required (medical clearance vs. Pharmacy clearance to hold med vs. Both)?  1. Pharmacy Clearance  4. Are there any medications that need to be held prior to surgery and how long? 1. Xarelto 3 days prior  5. Practice name and name of physician performing surgery?  1. [facility] - Dr. Rocco Serene  6. What is the office phone number?  1. [phone]   7.   What is the office fax number?         1.  [phone]   8.   Anesthesia type (None, local, MAC, general) ?          1.   Did not state    Desma Paganini 10/19/2019, 4:19 PM  _________________________________________________________________   (provider comments below)

## 2019-10-20 NOTE — Telephone Encounter (Signed)
Patient with diagnosis of Recurrent DVT on Xarelto for anticoagulation.    Procedure: colonoscopy Date of procedure: 11/17/19  CrCl 137 mL/min Platelet count 203K  Per office protocol, patient can hold Xarelto for 2 days prior to procedure.

## 2019-10-30 ENCOUNTER — Other Ambulatory Visit: Payer: Self-pay | Admitting: Family Medicine

## 2019-10-30 DIAGNOSIS — E1165 Type 2 diabetes mellitus with hyperglycemia: Secondary | ICD-10-CM

## 2019-10-30 DIAGNOSIS — I2510 Atherosclerotic heart disease of native coronary artery without angina pectoris: Secondary | ICD-10-CM

## 2019-10-30 DIAGNOSIS — I10 Essential (primary) hypertension: Secondary | ICD-10-CM

## 2019-11-05 ENCOUNTER — Other Ambulatory Visit: Payer: Self-pay | Admitting: Family Medicine

## 2019-11-05 DIAGNOSIS — I2510 Atherosclerotic heart disease of native coronary artery without angina pectoris: Secondary | ICD-10-CM

## 2019-11-05 DIAGNOSIS — I10 Essential (primary) hypertension: Secondary | ICD-10-CM

## 2019-11-08 ENCOUNTER — Ambulatory Visit (INDEPENDENT_AMBULATORY_CARE_PROVIDER_SITE_OTHER): Payer: 59

## 2019-11-08 DIAGNOSIS — I429 Cardiomyopathy, unspecified: Secondary | ICD-10-CM

## 2019-11-08 DIAGNOSIS — Z23 Encounter for immunization: Secondary | ICD-10-CM | POA: Diagnosis not present

## 2019-11-08 LAB — ECHOCARDIOGRAM COMPLETE
Area-P 1/2: 2.3 cm2
Calc EF: 50 %
S' Lateral: 3.69 cm
Single Plane A2C EF: 46.5 %
Single Plane A4C EF: 55.5 %

## 2019-11-09 ENCOUNTER — Telehealth: Payer: Self-pay | Admitting: *Deleted

## 2019-11-09 NOTE — Telephone Encounter (Signed)
-----   Message from Netta Neat., NP sent at 11/08/2019  6:50 PM EDT ----- Please call the patient and let him know the echocardiogram showed the pumping function is in the low normal range. 50-55%. . This has not changes since last echo in 2019. Very mildly leaking mitral valve. This is not significant and certainly nothing to worry about.

## 2019-11-09 NOTE — Telephone Encounter (Signed)
Lesle Chris, LPN  52/17/4715 1:33 PM EDT Back to Top    Notified, copy to pcp.

## 2019-11-13 ENCOUNTER — Other Ambulatory Visit: Payer: Self-pay | Admitting: Family Medicine

## 2019-11-13 DIAGNOSIS — I10 Essential (primary) hypertension: Secondary | ICD-10-CM

## 2019-11-13 DIAGNOSIS — I2510 Atherosclerotic heart disease of native coronary artery without angina pectoris: Secondary | ICD-10-CM

## 2019-11-13 NOTE — Telephone Encounter (Signed)
Requested medication (s) are due for refill today: yes  Requested medication (s) are on the active medication list: yes  Last refill:  10/30/19  #30  0 refills  Future visit scheduled: No Patient needs OV  Notes to clinic: Called left VM to return call to office for scheduling.    Requested Prescriptions  Pending Prescriptions Disp Refills   metoprolol tartrate (LOPRESSOR) 50 MG tablet [Pharmacy Med Name: Metoprolol Tartrate 50 MG Oral Tablet] 180 tablet 0    Sig: TAKE 1 TABLET BY MOUTH TWICE DAILY TO  DECREASE  THE  WORK  OF  YOUR  HEART      Cardiovascular:  Beta Blockers Failed - 11/13/2019  4:57 PM      Failed - Last BP in normal range    BP Readings from Last 1 Encounters:  10/17/19 (!) 142/98          Failed - Valid encounter within last 6 months    Recent Outpatient Visits           7 months ago Type 2 diabetes mellitus with hyperglycemia, without long-term current use of insulin (HCC)   [facility] Fulp, Boyd, MD   1 year ago Type 2 diabetes mellitus with hyperglycemia, without long-term current use of insulin (HCC)   [facility] Palmer, Athol L, RPH-CPP   1 year ago Type 2 diabetes mellitus with hyperglycemia, without long-term current use of insulin (HCC)   [facility] Fulp, [place], MD   1 year ago Essential hypertension   [facility] [place], [place], [place]   1 year ago Type 2 diabetes mellitus with hyperglycemia, without long-term current use of insulin [facility])   [facility] [place], Ray, MD              Passed - Last Heart Rate in normal range    Pulse Readings from Last 1 Encounters:  10/17/19 75

## 2019-11-13 NOTE — Telephone Encounter (Signed)
Note to clinic- I am unable to determine if this request for Losartan Potassium is appropriate to refill. According to the medication profile, it was last ordered on 04/05/19 and no order to discontinue found. On 10/16/19, Losartan hydrochlorothiazide was ordered by a historical provider.   Routing to clinic for consideration.

## 2021-05-03 IMAGING — CT CT CHEST W/O CM
2 of 4 series · 15 of 36 positions shown, 18 images · non-contrast
Comparison: January 05, 2017

CLINICAL DATA: Follow-up of a lung nodule. Recurring cough. Tobacco
dependence.

EXAM:
CT CHEST WITHOUT CONTRAST
TECHNIQUE: Multidetector CT imaging of the chest was performed following the
standard protocol without IV contrast.

[Series 2: routine chest without · axial · non-contrast · 0.77mm/px · z∈[-300,-10]mm · 12 of 171 slices shown, 15 images]
[im 13/171  mediastinal]
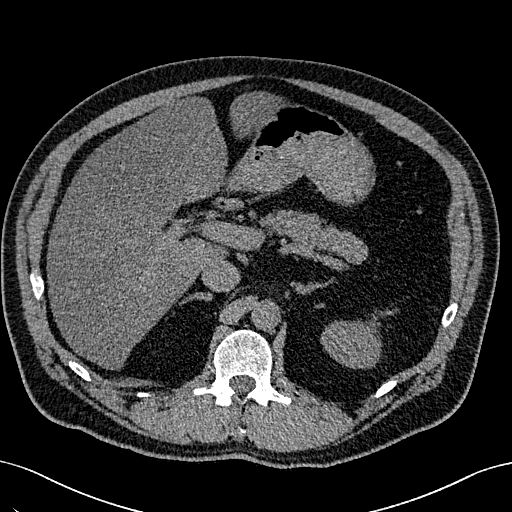
[im 13/171  lung]
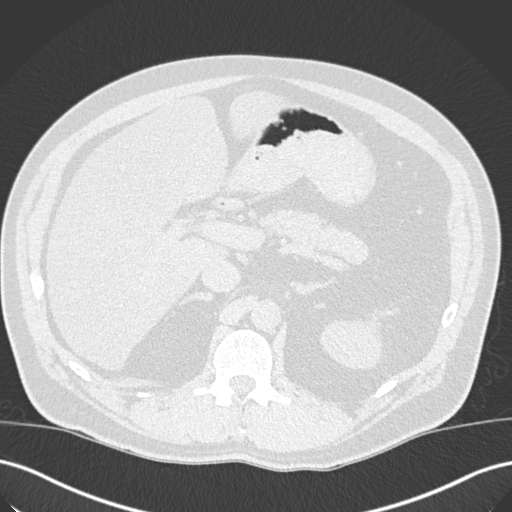
[im 25/171  lung]
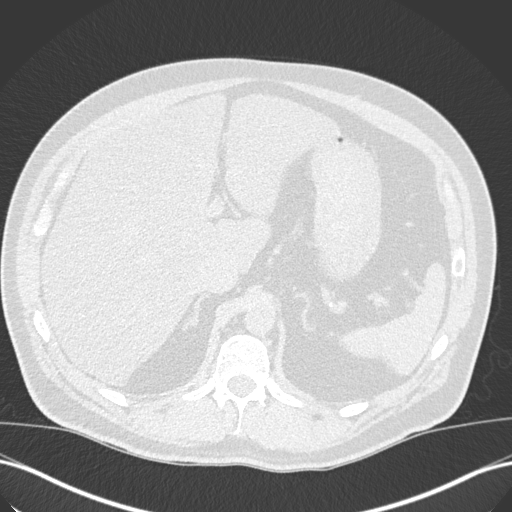
[im 37/171  lung]
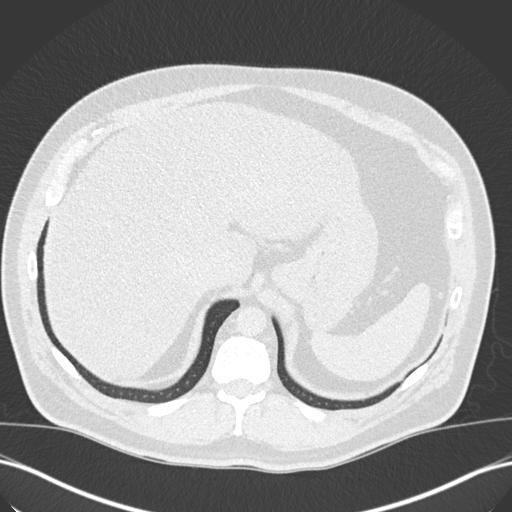
[im 49/171  lung]
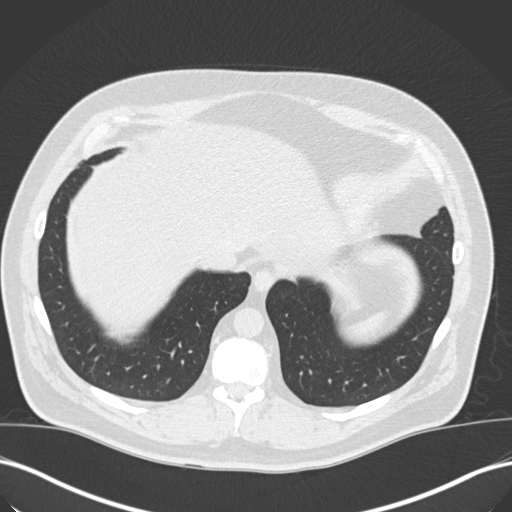
[im 61/171  mediastinal]
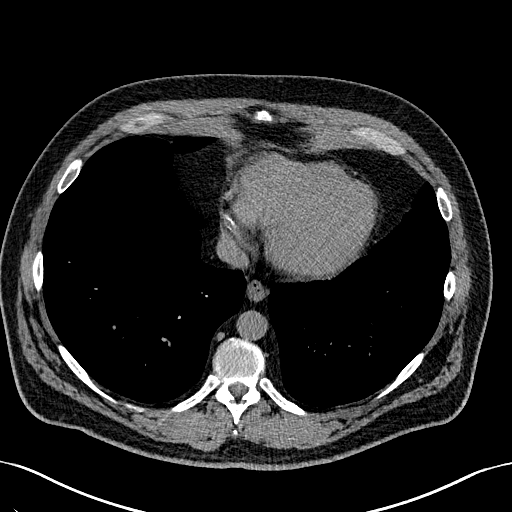
[im 61/171  lung]
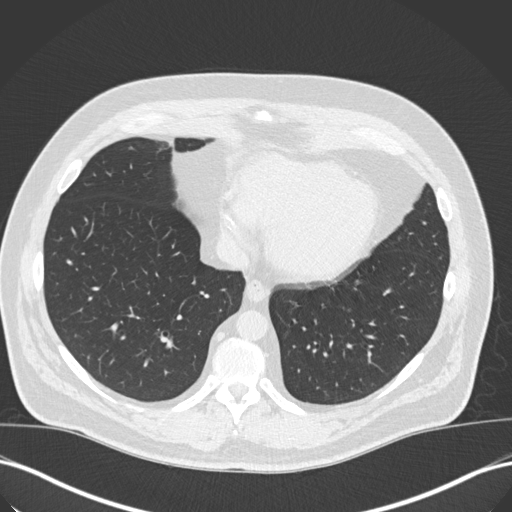
[im 73/171  lung]
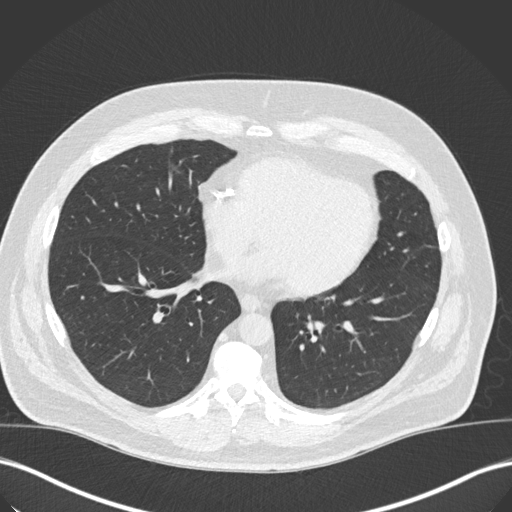
[im 98/171  lung]
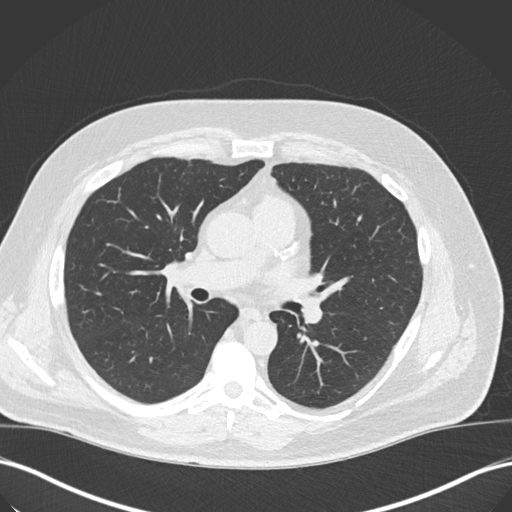
[im 110/171  lung]
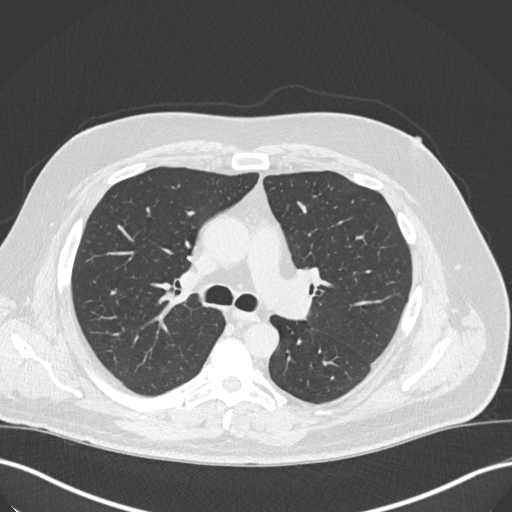
[im 122/171  mediastinal]
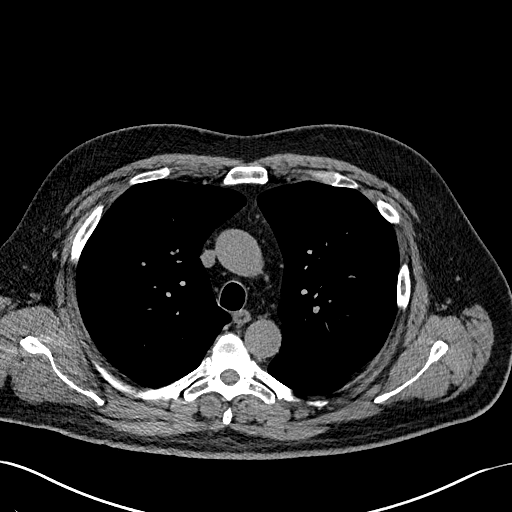
[im 122/171  lung]
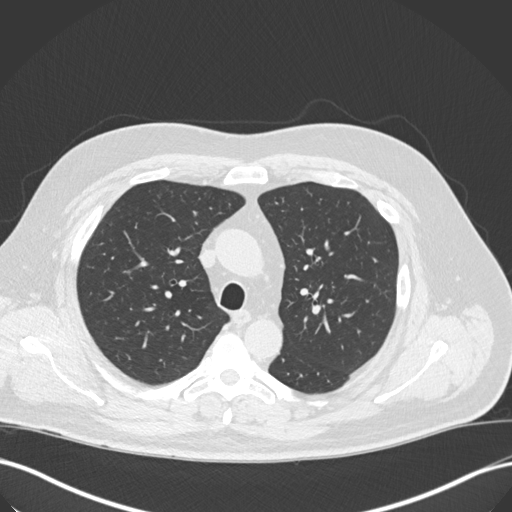
[im 134/171  lung]
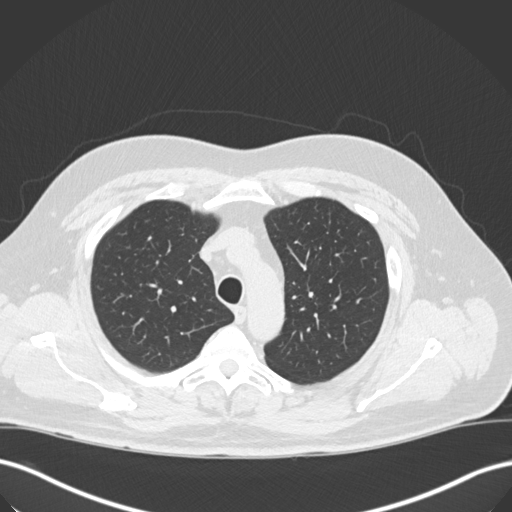
[im 146/171  lung]
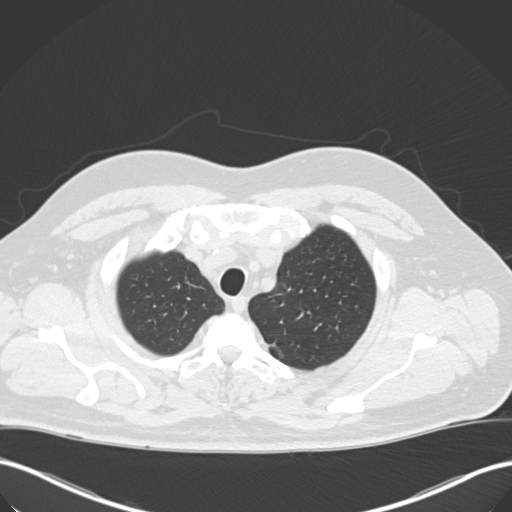
[im 158/171  lung]
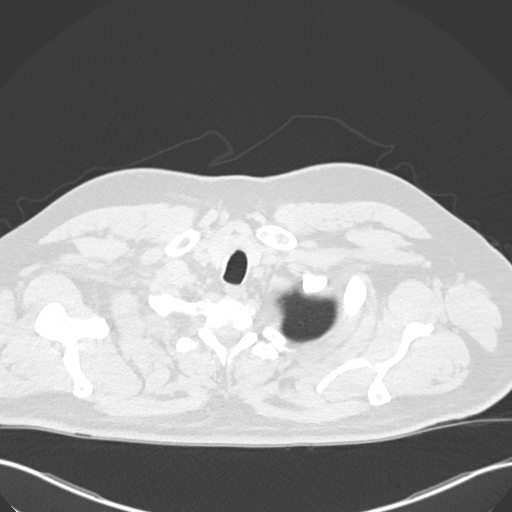

[Series 6: coronal · coronal · 0.72mm/px · 3 of 142 slices shown]
[im 29/142  lung]
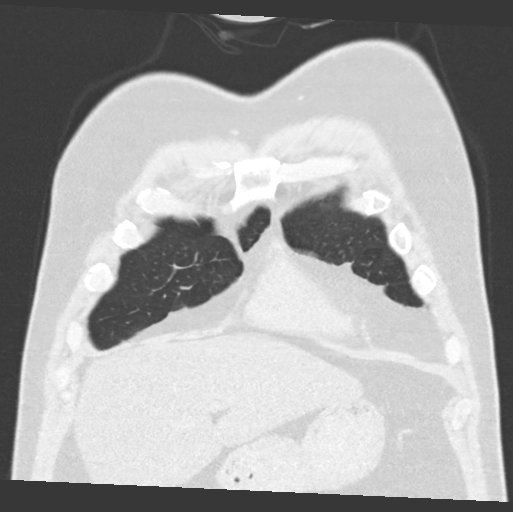
[im 57/142  lung]
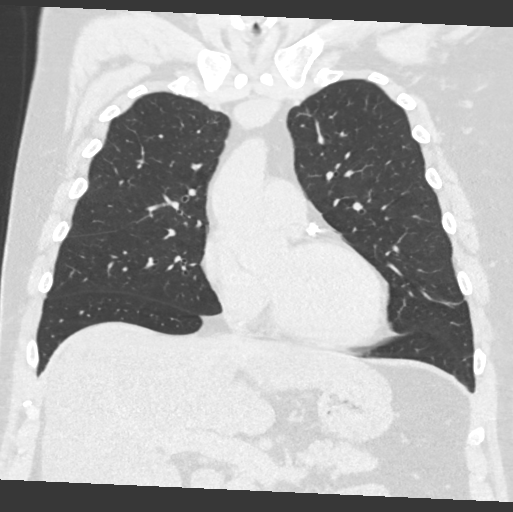
[im 85/142  lung]
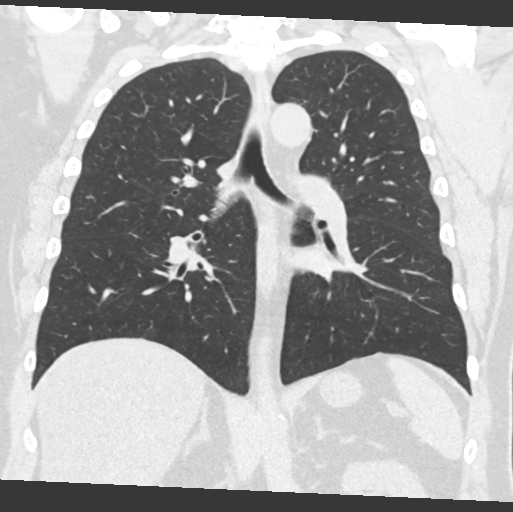

[15 of 36 positions shown; findings below may reference images not displayed]

FINDINGS: Cardiovascular: Normal heart size. No pericardial effusion. Heavy
calcific atherosclerotic disease of the coronary arteries. Milder
calcific atherosclerotic disease of the aorta.

Mediastinum/Nodes: No enlarged mediastinal or axillary lymph nodes.
Thyroid gland, trachea, and esophagus demonstrate no significant
findings.

Lungs/Pleura: Left lower lobe pulmonary nodule measures 4 mm, image
95/151, sequence 4. This pulmonary nodule is stable from 8389. A
second left lower lobe pulmonary nodule measures 2 mm, image 75/151,
sequence 4. A third ground-glass nodule in the left lower lobe
measures 4 mm, image 97/151, sequence 4.

5 mm perifissural soft tissue nodule in the right lower lobe, image
89/151, sequence 4. A second 2 mm subpleural pulmonary nodule in the
right lower lobe, image 107/151, sequence 4. Calcified tiny
granuloma in the subpleural right upper lobe.

Upper Abdomen: No acute abnormality.

Musculoskeletal: No chest wall mass or suspicious bone lesions
identified.
IMPRESSION: 1. Heavy calcific atherosclerotic disease of the coronary arteries.
2. Milder calcific atherosclerotic disease of the aorta.
3. Multiple bilateral less than 5 mm pulmonary nodules. Non-contrast
chest CT can be considered in 12 months if patient is high-risk.
This recommendation follows the consensus statement: Guidelines for
Management of Incidental Pulmonary Nodules Detected on CT Images:

Aortic Atherosclerosis ([id number].C).
